# Patient Record
Sex: Female | Born: 1940 | Race: Black or African American | Hispanic: No | Marital: Married | Smoking: Never smoker
Health system: Southern US, Community
[De-identification: ages and names within clinical notes are randomized; demographics above are authoritative.]

## PROBLEM LIST (undated history)

## (undated) DIAGNOSIS — E785 Hyperlipidemia, unspecified: Secondary | ICD-10-CM

## (undated) DIAGNOSIS — T8859XA Other complications of anesthesia, initial encounter: Secondary | ICD-10-CM

## (undated) DIAGNOSIS — D649 Anemia, unspecified: Secondary | ICD-10-CM

## (undated) DIAGNOSIS — K219 Gastro-esophageal reflux disease without esophagitis: Secondary | ICD-10-CM

## (undated) DIAGNOSIS — T7840XA Allergy, unspecified, initial encounter: Secondary | ICD-10-CM

## (undated) DIAGNOSIS — H269 Unspecified cataract: Secondary | ICD-10-CM

## (undated) DIAGNOSIS — M1990 Unspecified osteoarthritis, unspecified site: Secondary | ICD-10-CM

## (undated) DIAGNOSIS — K635 Polyp of colon: Secondary | ICD-10-CM

## (undated) DIAGNOSIS — T4145XA Adverse effect of unspecified anesthetic, initial encounter: Secondary | ICD-10-CM

## (undated) DIAGNOSIS — R112 Nausea with vomiting, unspecified: Secondary | ICD-10-CM

## (undated) DIAGNOSIS — I10 Essential (primary) hypertension: Secondary | ICD-10-CM

## (undated) DIAGNOSIS — J302 Other seasonal allergic rhinitis: Secondary | ICD-10-CM

## (undated) DIAGNOSIS — Z9889 Other specified postprocedural states: Secondary | ICD-10-CM

## (undated) DIAGNOSIS — E669 Obesity, unspecified: Secondary | ICD-10-CM

## (undated) HISTORY — DX: Hyperlipidemia, unspecified: E78.5

## (undated) HISTORY — DX: Obesity, unspecified: E66.9

## (undated) HISTORY — DX: Unspecified osteoarthritis, unspecified site: M19.90

## (undated) HISTORY — DX: Polyp of colon: K63.5

## (undated) HISTORY — DX: Essential (primary) hypertension: I10

## (undated) HISTORY — DX: Allergy, unspecified, initial encounter: T78.40XA

## (undated) HISTORY — PX: ORIF FINGER FRACTURE: SHX2122

## (undated) HISTORY — DX: Unspecified cataract: H26.9

## (undated) HISTORY — DX: Gastro-esophageal reflux disease without esophagitis: K21.9

## (undated) HISTORY — PX: OTHER SURGICAL HISTORY: SHX169

## (undated) HISTORY — DX: Other seasonal allergic rhinitis: J30.2

---
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
## 1970-12-16 HISTORY — PX: TUBAL LIGATION: SHX77

## 1996-12-16 HISTORY — PX: OTHER SURGICAL HISTORY: SHX169

## 1998-08-29 ENCOUNTER — Encounter: Admission: RE | Admit: 1998-08-29 | Discharge: 1998-09-15 | Payer: Self-pay | Admitting: Anesthesiology

## 2003-09-12 ENCOUNTER — Encounter: Payer: Self-pay | Admitting: Internal Medicine

## 2005-10-02 ENCOUNTER — Ambulatory Visit: Payer: Self-pay | Admitting: Internal Medicine

## 2005-10-03 ENCOUNTER — Ambulatory Visit: Payer: Self-pay | Admitting: Internal Medicine

## 2005-11-27 ENCOUNTER — Ambulatory Visit: Payer: Self-pay | Admitting: Internal Medicine

## 2006-12-16 HISTORY — PX: EYE SURGERY: SHX253

## 2007-04-30 ENCOUNTER — Ambulatory Visit: Payer: Self-pay | Admitting: Orthopedic Surgery

## 2007-05-04 ENCOUNTER — Encounter: Payer: Self-pay | Admitting: Orthopedic Surgery

## 2007-11-16 ENCOUNTER — Encounter: Payer: Self-pay | Admitting: Internal Medicine

## 2008-06-15 ENCOUNTER — Other Ambulatory Visit: Payer: Self-pay

## 2008-06-15 ENCOUNTER — Ambulatory Visit: Payer: Self-pay | Admitting: Ophthalmology

## 2008-06-29 ENCOUNTER — Ambulatory Visit: Payer: Self-pay | Admitting: Ophthalmology

## 2008-09-15 ENCOUNTER — Ambulatory Visit: Payer: Self-pay | Admitting: Ophthalmology

## 2008-09-28 ENCOUNTER — Ambulatory Visit: Payer: Self-pay | Admitting: Ophthalmology

## 2008-12-14 ENCOUNTER — Ambulatory Visit: Payer: Self-pay | Admitting: Internal Medicine

## 2008-12-27 ENCOUNTER — Ambulatory Visit: Payer: Self-pay | Admitting: Internal Medicine

## 2008-12-27 ENCOUNTER — Encounter: Payer: Self-pay | Admitting: Internal Medicine

## 2008-12-28 ENCOUNTER — Encounter: Payer: Self-pay | Admitting: Internal Medicine

## 2010-02-02 ENCOUNTER — Encounter: Payer: Self-pay | Admitting: Orthopedic Surgery

## 2010-02-07 ENCOUNTER — Ambulatory Visit: Payer: Self-pay | Admitting: Orthopedic Surgery

## 2010-02-07 DIAGNOSIS — M1710 Unilateral primary osteoarthritis, unspecified knee: Secondary | ICD-10-CM

## 2010-02-07 DIAGNOSIS — M47817 Spondylosis without myelopathy or radiculopathy, lumbosacral region: Secondary | ICD-10-CM

## 2010-02-07 DIAGNOSIS — M1610 Unilateral primary osteoarthritis, unspecified hip: Secondary | ICD-10-CM | POA: Insufficient documentation

## 2010-04-30 ENCOUNTER — Encounter (INDEPENDENT_AMBULATORY_CARE_PROVIDER_SITE_OTHER): Payer: Self-pay | Admitting: *Deleted

## 2010-05-02 ENCOUNTER — Telehealth: Payer: Self-pay | Admitting: Orthopedic Surgery

## 2010-05-08 ENCOUNTER — Ambulatory Visit: Payer: Self-pay | Admitting: Orthopedic Surgery

## 2010-05-08 DIAGNOSIS — M76899 Other specified enthesopathies of unspecified lower limb, excluding foot: Secondary | ICD-10-CM | POA: Insufficient documentation

## 2010-05-28 ENCOUNTER — Telehealth (INDEPENDENT_AMBULATORY_CARE_PROVIDER_SITE_OTHER): Payer: Self-pay | Admitting: *Deleted

## 2010-06-01 ENCOUNTER — Telehealth: Payer: Self-pay | Admitting: Orthopedic Surgery

## 2010-06-07 ENCOUNTER — Telehealth: Payer: Self-pay | Admitting: Orthopedic Surgery

## 2010-06-07 DIAGNOSIS — K319 Disease of stomach and duodenum, unspecified: Secondary | ICD-10-CM | POA: Insufficient documentation

## 2010-06-07 DIAGNOSIS — I10 Essential (primary) hypertension: Secondary | ICD-10-CM | POA: Insufficient documentation

## 2010-06-07 DIAGNOSIS — K219 Gastro-esophageal reflux disease without esophagitis: Secondary | ICD-10-CM

## 2010-06-07 DIAGNOSIS — K7689 Other specified diseases of liver: Secondary | ICD-10-CM | POA: Insufficient documentation

## 2010-06-07 DIAGNOSIS — M129 Arthropathy, unspecified: Secondary | ICD-10-CM | POA: Insufficient documentation

## 2010-06-07 DIAGNOSIS — E1142 Type 2 diabetes mellitus with diabetic polyneuropathy: Secondary | ICD-10-CM | POA: Insufficient documentation

## 2010-06-07 DIAGNOSIS — E119 Type 2 diabetes mellitus without complications: Secondary | ICD-10-CM

## 2010-06-07 DIAGNOSIS — K648 Other hemorrhoids: Secondary | ICD-10-CM | POA: Insufficient documentation

## 2010-06-11 ENCOUNTER — Encounter: Payer: Self-pay | Admitting: Orthopedic Surgery

## 2010-06-12 ENCOUNTER — Encounter: Payer: Self-pay | Admitting: Orthopedic Surgery

## 2010-06-13 ENCOUNTER — Ambulatory Visit: Payer: Self-pay | Admitting: Internal Medicine

## 2010-06-13 DIAGNOSIS — R112 Nausea with vomiting, unspecified: Secondary | ICD-10-CM | POA: Insufficient documentation

## 2010-06-13 DIAGNOSIS — R1013 Epigastric pain: Secondary | ICD-10-CM | POA: Insufficient documentation

## 2010-06-26 ENCOUNTER — Ambulatory Visit: Payer: Self-pay | Admitting: Orthopedic Surgery

## 2010-06-26 DIAGNOSIS — M23302 Other meniscus derangements, unspecified lateral meniscus, unspecified knee: Secondary | ICD-10-CM | POA: Insufficient documentation

## 2010-08-08 ENCOUNTER — Ambulatory Visit: Payer: Self-pay | Admitting: Orthopedic Surgery

## 2011-01-15 NOTE — Assessment & Plan Note (Signed)
Summary: Follow up Visit  Loretta Barnett MR#:  [id number] Page #  NAME:  Loretta Barnett, Loretta Barnett  OFFICE NO:  [id number]  DATE:  11/27/05  DOB:  Dec 10, 2041  HISTORY:  The patient presents today for followup.  She is a 70 year old who was evaluated October 02, 2005 for abdominal pain.  See that dictation for details.  Her symptoms were felt secondary to recurrent reflux disease.  I was not certain that this would explain all of her symptoms however.  She underwent an abdominal ultrasound, which was negative.  No gallbladder disease.  She was placed on Prevacid, which she states helps her indigestion and heartburn.  She thought this may have resulted though in some constipation and gas.  She also reports occasional sharp pain from the epigastrium in between the shoulder blades.  No other issues or problems.  Overall, she is improved.    CURRENT MEDICATIONS:  Include potassium, Hyzaar, Avandamet, multivitamin, folic acid, calcium, aspirin, and Prevacid.   PHYSICAL EXAMINATION:  Finds a well-appearing female in no acute distress.  Blood pressure 150/90, heart rate 68, weight is 209.6 pounds.  HEENT: Sclerae are anicteric.  The lungs are clear. The heart is regular. Abdomen is soft without tenderness, mass, or hernia.      IMPRESSION:   1.  Gastroesophageal reflux disease.  Classic reflux symptoms are improved on Prevacid.  Possible side effect of constipation.  2.  Intermittent sharp epigastric pain with radiation in her shoulder blades, possibly due to intestinal spasm.  No evidence of gallbladder or pancreas disease by recent ultrasound.   RECOMMENDATIONS:   1.  Change from Prevacid to Protonix in hopes of less in the way of constipation.   2.  Add Levsin sublingual on a p.r.n. basis for sharp pain that radiates to the shoulder blades. 3.  Return to this office as needed for persistent or worsening symptoms.  If she were to return, she may warrant endoscopy and/or CT scan, otherwise, she will continue her general  medical care with Dr. Anne Hahn.       Wilhemina Bonito. Eda Keys., M.D. [id number] cc:  Dorise Hiss, M.D. D:  11/27/05; T:  ; Job [phone]

## 2011-01-15 NOTE — Progress Notes (Signed)
Summary: wants voltaren prescription  Phone Note Call from Patient   Summary of Call: Loretta Barnett (2041-10-20) receives the Voltaraen Gel thru the mail and has not received it .  She is going out of town  and needs a prescription called [facility] [place] so she will have it before she leaves tomorrow.   Her # [id number] or cell # [phone] Initial call taken by: Jacklynn Ganong,  May 02, 2010 11:24 AM  Follow-up for Phone Call        proceed that's okay Follow-up by: Fuller Canada MD,  May 02, 2010 11:29 AM    Prescriptions: VOLTAREN 1 % GEL (DICLOFENAC SODIUM) 4g  two times a day  #5 tubes x 5   Entered by:   Ether Griffins   Authorized by:   Fuller Canada MD   Signed by:   Ether Griffins on 05/02/2010   Method used:   [facility]axed to ...       [facility]  [address] [id number] (retail[facility]       [address]       [place], [place]  [address]       Ph: [phone] or [phone]       Fax: [phone]   RxID:   [id number]

## 2011-01-15 NOTE — Letter (Signed)
Summary: New Patient letter  [facility]  [address] [place], [place] [address]   Phone: [phone]  Fax: [phone]       04/30/2010 MRN: [id number]  [facility] [address] [place], [place]  [address]  Dear Loretta Barnett,  Welcome to the Gastroenterology Division at [facility].    You are scheduled to see Dr.  Marina Goodell on 06-13-10 at 10AM on the 3rd floor at [facility], [address]  We ask that you try to arrive at our office 15 minutes prior to your appointment time to allow for check-in.  We would like you to complete the enclosed self-administered evaluation form prior to your visit and bring it with you on the day of your appointment.  We will review it with you.  Also, please bring a complete list of all your medications or, if you prefer, bring the medication bottles and we will list them.  Please bring your insurance card so that we may make a copy of it.  If your insurance requires a referral to see a specialist, please bring your referral form from your primary care physician.  Co-payments are due at the time of your visit and may be paid by cash, check or credit card.     Your office visit will consist of a consult with your physician (includes a physical exam), any laboratory testing he/she may order, scheduling of any necessary diagnostic testing (e.g. x-ray, ultrasound, CT-scan), and scheduling of a procedure (e.g. Endoscopy, Colonoscopy) if required.  Please allow enough time on your schedule to allow for any/all of these possibilities.    If you cannot keep your appointment, please call [phone] to cancel or reschedule prior to your appointment date.  This allows [place] the opportunity to schedule an appointment for another patient in need of care.  If you do not cancel or reschedule by 5 p.m. the business day prior to your appointment date, you will be charged a $50.00 late cancellation/no-show fee.    Thank you for choosing [facility] for your medical needs.  We appreciate the opportunity to care for you.  Please visit [place] at our website  to learn more about our practice.                     Sincerely,                                                             The Gastroenterology Division

## 2011-01-15 NOTE — Assessment & Plan Note (Signed)
Summary: MID EPIGASTRIC & UPPER R Q PAIN/NAUSEA & VOMITTING/YF   History of Present Illness Visit Type: consult  Primary GI MD: Yancey Flemings MD Primary Provider: Lucianne Lei, MD  Requesting Provider: Lucianne Lei, MD  Chief Complaint: Generalized abd pain, nausea, vomiting, belching, bloating, and GERD  History of Present Illness:   70 year old female with diabetes mellitus x20 years, hypertension, arthritis, obesity, and GERD. She presents today for evaluation of chronic problems with nausea and vomiting as well as recent problems with epigastric discomfort. She was last evaluated in our endoscopy center in January of 2004 routine screening colonoscopy due to a family history. The examination was normal except for a diminutive non-adenomatous polyp which was removed. She tells me that she has had a one-year history of intermittent problems with nausea and vomiting. She feels that she is intolerant to foods that contain soy as well as certain meats and fruits. She feels that she has "poor digestion". She has the symptoms once every 2 weeks, generally in the morning after taking medications. In May she developed problems with epigastric pain and rectal quadrant discomfort with radiation to the back. She was having reflux symptoms at that time on over-the-counter Prilosec. She had also been on NSAIDs for joint aches in her knee. She was instructed to stop NSAIDs and was started on Dexilant 60 mg daily. She also underwent an abdominal ultrasound on May 07, 2010 at [facility]. This was unremarkable. No gallstones. Her weight has been stable. No GI bleeding.   GI Review of Systems    Reports abdominal pain, acid reflux, belching, bloating, heartburn, nausea, and  vomiting.     Location of  Abdominal pain: upper abdomen.    Denies chest pain, dysphagia with liquids, dysphagia with solids, loss of appetite, vomiting blood, weight loss, and  weight gain.        Denies anal fissure, black tarry stools,  change in bowel habit, constipation, diarrhea, diverticulosis, fecal incontinence, heme positive stool, hemorrhoids, irritable bowel syndrome, jaundice, light color stool, liver problems, rectal bleeding, and  rectal pain.    Current Medications (verified): 1)  Voltaren 1 % Gel (Diclofenac Sodium) .... 4g  Two Times A Day 2)  Dexilant 60 Mg Cpdr (Dexlansoprazole) .... Take One By Mouth Once Daily 3)  Promethazine Hcl 25 Mg Tabs (Promethazine Hcl) .... Take One By Mouth Every 6 Hours As Needed 4)  Metformin Hcl 1000 Mg Tabs (Metformin Hcl) .... One Tablet By Mouth Two Times A Day 5)  Hyzaar 100-25 Mg Tabs (Losartan Potassium-Hctz) .... One By Mouth Once Daily 6)  Lasix 40 Mg Tabs (Furosemide) .... 1/2  Tablet By Mouth Once Daily 7)  Caltrate 600+d 600-400 Mg-Unit Tabs (Calcium Carbonate-Vitamin D) .... Take Two By Mouth Once Daily 8)  Januvia 100 Mg Tabs (Sitagliptin Phosphate) .... One Tablet By Mouth Once Daily 9)  Vitamin C 500 Mg Tabs (Ascorbic Acid) .... One By Mouth Once Daily  Allergies (verified): 1)  ! Lidocaine 2)  ! Codeine 3)  ! Darvocet 4)  ! * All Caines  Past History:  Past Medical History: Arthritis seasonal allergies Diabetes Hypertension GERD Obesity  Past Surgical History: Reviewed history from 02/07/2010 and no changes required. Fracture left leg and ankle has plates and screws tonsils adenoids tubal ligation arthroscopy right knee.  Family History: Reviewed history from 06/07/2010 and no changes required. FH of Cancer: father Family History of Diabetes: father Family History of Arthritis: mother Family History of Pancreatic Cancer: father  Social History:  Reviewed history from 02/07/2010 and no changes required. 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  The patient denies anemia,  anxiety-new, blood in urine, breast changes/lumps, change in vision, confusion, cough, coughing up blood, depression-new, fainting, fatigue, fever, headaches-new, hearing problems, heart murmur, heart rhythm changes, itching, menstrual pain, muscle pains/cramps, night sweats, nosebleeds, pregnancy symptoms, shortness of breath, skin rash, sleeping problems, sore throat, swollen lymph glands, thirst - excessive , urination - excessive , urination changes/pain, urine leakage, vision changes, and voice change.    Vital Signs:  Patient profile:   70 year old female Height:      65 inches Weight:      197 pounds BMI:     32.90 BSA:     1.97 Pulse rate:   82 / minute Pulse rhythm:   regular BP sitting:   132 / 84  (right arm) Cuff size:   regular  Vitals Entered By: Ok Anis CMA (June 13, 2010 10:15 AM)  Physical Exam  General:  Well developed, well nourished, no acute distress. Head:  Normocephalic and atraumatic. Eyes:  PERRLA, no icterus. Nose:  No deformity, discharge,  or lesions. Mouth:  No deformity or lesions, dentition normal. Neck:  Supple; no masses or thyromegaly. Lungs:  Clear throughout to auscultation. Heart:  Regular rate and rhythm; no murmurs, rubs,  or bruits. Abdomen:  Soft, obese,nontender and nondistended. No masses, hepatosplenomegaly or hernias noted. Normal bowel sounds. No succussion splash. Msk:  Symmetrical with no gross deformities. Normal posture. Pulses:  Normal pulses noted. Extremities:  No  edema or deformities noted. Neurologic:  Alert and  oriented x4;  Skin:  Intact without significant lesions or rashes. Psych:  Alert and cooperative. Normal mood and affect.   Impression & Recommendations:  Problem # 1:  ABDOMINAL PAIN-EPIGASTRIC (ICD-789.06) Recent problems with epigastric pain likely due to worsening GERD versus NSAID-related gastropathy. Symptoms improved on higher dose PPI and NSAID avoidance.  Plan: #1. Continued excellent. She has a  prescription. Additional samples provided #2. Reflux precautions #3. Avoid unnecessary NSAIDs #4. Follow up here p.r.n.  Problem # 2:  NAUSEA AND VOMITING (ICD-787.01) chronic intermittent problems with nausea and vomiting. 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 This may exacerbate underlying GERD.  Plan: #1. Gastroparesis diet #2. Continue Dexilant #3. Follow up here p.r.n.  Problem # 3:  GERD (ICD-530.81) GERD not controlled with Prilosec. Fruit on excellent. Suspect worsening gastric emptying has demonstrated GERD. She will continue with Dexilant and reflux precautions.  Problem # 4:  COLORECTAL CANCER, FAMILY HX (ICD-V16.0) prior screening exam x2 negative for neoplasia. Due for routine followup around 2015  Patient Instructions: 1)  samples of Dexilant given to patient. 2)  Please schedule a follow-up appointment as needed.  3)  The medication list was reviewed and reconciled.  All changed / newly prescribed medications were explained.  A complete medication list was provided to the patient / caregiver. 4)  Copy: Dr. Lucianne Lei

## 2011-01-15 NOTE — Progress Notes (Signed)
Summary: call from patient LT knee question / MRI   Phone Note Call from Patient   Caller: Patient Summary of Call: Patient called back c/o continuing LT knee pain. States gel not seeming to help much. States LT knee hurts "inside & in back of knee" and that it "buckles"  sometimes.  Asked if she can possibly have an MRI   Please advise.   Pt's Home# [phone] and Work [phone] Initial call taken by: Cammie Sickle,  May 28, 2010 9:24 AM  Follow-up for Phone Call        MRI left knee for pain and buckling  Follow-up by: Fuller Canada MD,  May 28, 2010 9:32 AM
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
## 2011-01-15 NOTE — Procedures (Signed)
Summary: EGD/[facility]  EGD/[facility]   Imported By: Sherian Rein 06/14/2010 07:11:21  _____________________________________________________________________  External Attachment:    Type:   Image     Comment:   External Document

## 2011-01-15 NOTE — Assessment & Plan Note (Signed)
Summary: discuss knee scope/medicare/uhc.cbt   Visit Type:  Follow-up Referring Provider:  Lucianne Lei, MD  Primary Provider:  Lucianne Lei, MD   CC:  left knee pain.  History of Present Illness: I saw Loretta Barnett in the office today for a followup visit.  She is a 70 years old woman with the complaint of:  left knee.  Meds: Hyzaar, Metformin, Januvia, Multivitamin, Zyrtec, Ibuprofen 200mg  as needed, Voltaren gel, ASA, Tylenol, Vitamin C. Folic acid.  [facility] and it showed a meniscal tear with arthritis of the medial compartment she is here today to discuss possible surgery on the LEFT knee  After discussing this with her she is not having constant pain and only has infrequent giving way episodes.  We decided to wait on having any surgery until her symptoms worsen or giving way he becomes more problematic  MRI reviewed with report       Allergies: 1)  ! Lidocaine 2)  ! Codeine 3)  ! Darvocet 4)  ! * All Caines   Impression & Recommendations:  Problem # 1:  KNEE, ARTHRITIS, DEGEN./OSTEO (ICD-715.96) Assessment Unchanged  Orders: Est. Patient Level II (04540)  Problem # 2:  DERANGEMENT MENISCUS (ICD-717.5) Assessment: Unchanged  Orders: Est. Patient Level II (98119)  Patient Instructions: 1)  Please schedule a follow-up appointment in 6 weeks 2)  review the knee if things get worse before then call us sooner

## 2011-01-15 NOTE — Progress Notes (Signed)
Summary: MRI appointment.  Phone Note Outgoing Call   Call placed by: Waldon Reining,  June 07, 2010 11:58 AM Call placed to: Patient Action Taken: Appt scheduled Summary of Call: I called to give the patient her MRI appointment at [facility] on 06-11-10 at 5:15. Patient has Medicare and [employer], no precert is needed. Dr. Romeo Apple will call her with results. Patient will bring her films here to the office.

## 2011-01-15 NOTE — Procedures (Signed)
Summary: Colonoscopy   Colonoscopy  Procedure date:  09/12/2003  Findings:      Results: Hemorrhoids.     Location:  [facility].    Procedures Next Due Date:    Colonoscopy: 09/2008 Patient Name: Loretta Barnett, Loretta Barnett MRN:  Procedure Procedures: Colonoscopy CPT: 54098.  Personnel: Endoscopist: Wilhemina Bonito. Marina Goodell, MD.  Exam Location: Exam performed in Outpatient Clinic. Outpatient  Patient Consent: Procedure, Alternatives, Risks and Benefits discussed, consent obtained, from patient. Consent was obtained by the RN.  Indications Symptoms: Hematochezia.  Increased Risk Screening: For family history of colorectal neoplasia, in  parent  History  Pre-Exam Physical: Performed Sep 12, 2003. Entire physical exam was normal.  Exam Exam: Extent of exam reached: Cecum, extent intended: Cecum.  The cecum was identified by appendiceal orifice and IC valve. Patient position: on left side. Colon retroflexion performed. Images taken. ASA Classification: II. Tolerance: excellent.  Monitoring: Pulse and BP monitoring, Oximetry used. Supplemental O2 given.  Colon Prep Used Golytely for colon prep. Prep results: good.  Sedation Meds: Patient assessed and found to be appropriate for moderate (conscious) sedation. Fentanyl 50 mcg. given IV. Versed 5 mg. given IV.  Findings NORMAL EXAM: Cecum to Rectum. Comments: small internal hemorrhoids present.   Assessment Abnormal examination, see findings above.  Diagnoses: 455.0: Hemorrhoids, Internal.   Events  Unplanned Interventions: No intervention was required.  Unplanned Events: There were no complications. Plans Disposition: After procedure patient sent to recovery. After recovery patient sent home.  Scheduling/Referral: Colonoscopy, to Wilhemina Bonito. Marina Goodell, MD, in 5 years.,    This report was created from the original endoscopy report, which was reviewed and signed by the above listed endoscopist.   cc: Lucianne Lei, MD  The Patient

## 2011-01-15 NOTE — Miscellaneous (Signed)
  spoke to [facility], she needs surgery;  will call back   Clinical Lists Changes

## 2011-01-15 NOTE — Miscellaneous (Signed)
Summary: phone numbers  [phone]  [phone]  [phone]  Clinical Lists Changes

## 2011-01-15 NOTE — Letter (Signed)
Summary: Historic Patient File  Historic Patient File   Imported By: Elvera Maria 02/09/2010 10:07:25  _____________________________________________________________________  External Attachment:    Type:   Image     Comment:   history form

## 2011-01-15 NOTE — Assessment & Plan Note (Signed)
Summary: LT KNEE PAIN/NEED XRAYS/MEDICARE,UHC/CAF   Visit Type:  Follow-up Referring Provider:  self Primary Provider:  Dr. Lucianne Lei  CC:  left knee pain.  History of Present Illness: I saw Loretta Barnett in the office today for a followup visit.  She is a 70 years old woman with the complaint of:  lef tknee.  Meds: Hyzaar, Metformin, Januvia, Multivitamin, Zyrtec, Ibuprofen 200mg  as needed, Voltaren gel, Tylenol.  Last seen in 02/11 for her knee.  She has had swelling in her leg for 3 weeks now. She went to See her primary care doctor last week and he said everything check out good.  She is not taking the Tylenol that often because it was hurting her stomach.  She is having pain on the inside of her knee at the bursae.  Allergies: 1)  ! Lidocaine 2)  ! Codeine 3)  ! Darvocet  Review of Systems Neurologic:  Denies numbness, tingling, and unsteady gait. Musculoskeletal:  Complains of joint pain and swelling; denies instability and stiffness.  Physical Exam  Additional Exam:  1. General appearance was normal  2. Oriented x 3  3. Mood was normal  4. Gait was normal  LEFT knee inspection there was a swollen tender peds anserine bursa with a nontender medial joint line, range of motion was not affected joint was stable motor grade was 5.  Skin is intact pulses and sensation were normal.      Impression & Recommendations:  Problem # 1:  BURSITIS, LEFT KNEE (ICD-726.60) Assessment New  got about giving her an injection but she said last time her glucose went out and she had to pull over on the side of the road because of dizziness  As I recommended topical anti-inflammatory twice a day  Orders: Est. Patient Level III (16109)  Patient Instructions: 1)  Voltaren gel two times a day over sore area on the knee 2)  come back as needed

## 2011-01-15 NOTE — Assessment & Plan Note (Signed)
Summary: 6 WK RE-CK KNEE/MEDICARE,UHC/CAF   Visit Type:  Follow-up Referring Provider:  Lucianne Lei, MD  Primary Provider:  Lucianne Lei, MD   CC:  recheck knee.  History of Present Illness: I saw Loretta Barnett in the office today for a followup visit.  She is a 70 years old woman with the complaint of:  left knee.  Meds: Hyzaar, Metformin, Januvia, Multivitamin, Zyrtec, Ibuprofen 200mg  as needed, Voltaren gel, ASA, Tylenol, Vitamin C. Folic acid.   MRI which showed arthritis and torn medial meniscus we decided against surgery at this point  Today is a 6 week recheck left knee.  Doing better, using heat, ice, Aleve as needed and Voltaren gel.       Allergies: 1)  ! Lidocaine 2)  ! Codeine 3)  ! Darvocet 4)  ! * All [place]  Physical Exam  Additional Exam:  the knee looks good today.  She has full range of motion.  Excellent quadriceps strength.  Maybe a hint of swelling in the lateral compartment.  She is ambulating well.  She has normal sensation.  Skin is intact.  The knee is stable.  The only thing I detected was a small amount of pain with the McMurray's maneuver.   Impression & Recommendations:  Problem # 1:  DERANGEMENT MENISCUS (ICD-717.5) Assessment Improved  Orders: Est. Patient Level II (91478)

## 2011-01-15 NOTE — Progress Notes (Signed)
Summary: insurance verification  Phone Note Outgoing Call   Call placed to: Insurer Summary of Call: [employer] + call to patient. Verified Medicare primary as of 12/16/2009 and [place] [employer] secondary, under husband, retiree [employer]. 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 Verified w/BCBS per [place], [place] [phone].   I verified all current ins-related information as follows:  [employer] for Medicare (active, primary); [employer] through automated system, active secondary.  Initial call taken by: Cammie Sickle,  June 01, 2010 10:08 AM

## 2011-01-18 NOTE — Progress Notes (Signed)
Summary: Office Visit  Office Visit   Imported By: Elvera Maria 02/06/2010 11:10:36  _____________________________________________________________________  External Attachment:    Type:   Image     Comment:   initial eval

## 2011-04-01 LAB — GLUCOSE, CAPILLARY
Glucose-Capillary: 107 mg/dL — ABNORMAL HIGH (ref 70–99)
Glucose-Capillary: 117 mg/dL — ABNORMAL HIGH (ref 70–99)

## 2012-03-02 ENCOUNTER — Ambulatory Visit (INDEPENDENT_AMBULATORY_CARE_PROVIDER_SITE_OTHER): Payer: Medicare Other | Admitting: Internal Medicine

## 2012-03-02 ENCOUNTER — Encounter: Payer: Self-pay | Admitting: Internal Medicine

## 2012-03-02 VITALS — BP 118/60 | HR 66 | Ht 65.0 in | Wt 191.6 lb

## 2012-03-02 DIAGNOSIS — R141 Gas pain: Secondary | ICD-10-CM

## 2012-03-02 DIAGNOSIS — K219 Gastro-esophageal reflux disease without esophagitis: Secondary | ICD-10-CM

## 2012-03-02 DIAGNOSIS — R197 Diarrhea, unspecified: Secondary | ICD-10-CM

## 2012-03-02 DIAGNOSIS — R1011 Right upper quadrant pain: Secondary | ICD-10-CM

## 2012-03-02 MED ORDER — ALIGN 4 MG PO CAPS: 1.0000 | ORAL_CAPSULE | Freq: Every day | ORAL | Status: DC

## 2012-03-02 NOTE — Progress Notes (Signed)
HISTORY OF PRESENT ILLNESS:  Loretta Barnett is a 71 y.o. female with long-standing diabetes mellitus, hypertension, arthritis, obesity, GERD, and family history of colon cancer. The patient was last seen in the office 06/13/2010. See that dictation. Prior colonoscopies in 2004 and most recently January 2010. Most recent exam with diminutive non-adenomatous polyp only. She presents today regarding diarrhea and right upper quadrant pain. First, she reports developing severe diarrhea at the end of January. The problem persisted for about 4 weeks that resolved. Currently being plagued with increased intestinal gas. No weight loss or GI bleeding. No vomiting. She also reports a many month history of fleeting right upper quadrant pain that can occur on a daily basis. She describes the discomfort as sharp and lasting only a few seconds. No exacerbating or relieving factors. She tells me that she underwent an abdominal ultrasound in December which was negative. Overall, her medical problems have been stable. Last hemoglobin A1c reported to be 6.6.  REVIEW OF SYSTEMS:  All non-GI ROS negative except for arthritis  Past Medical History  Diagnosis Date  . Arthritis   . Seasonal allergies   . Diabetes mellitus   . Hypertension   . GERD (gastroesophageal reflux disease)   . Obesity   . Colon polyp     Past Surgical History  Procedure Date  . Leg surgery     left leg and ankle fracture  . Tonsillectomy and adenoidectomy   . Tubal ligation   . Knee arthroscopy     rt    Social History Loretta Barnett  reports that she has never smoked. She has never used smokeless tobacco. She reports that she does not drink alcohol or use illicit drugs.  family history includes Arthritis in her mother; Diabetes in her father; and Pancreatic cancer in her father.  Allergies  Allergen Reactions  . Codeine     REACTION: nausea/vomiting  . Lidocaine     REACTION: wheezing  . Propoxyphene N-Acetaminophen    REACTION: nausea/vomiting       PHYSICAL EXAMINATION: Vital signs: BP 118/60  Pulse 66  Ht 5\' 5"  (1.651 m)  Wt 191 lb 9.6 oz (86.909 kg)  BMI 31.88 kg/m2 General: Well-developed, well-nourished, no acute distress HEENT: Sclerae are anicteric, conjunctiva pink. Oral mucosa intact Lungs: Clear Heart: Regular Abdomen: soft,obese, nontender, nondistended, no obvious ascites, no peritoneal signs, normal bowel sounds. No organomegaly. Extremities: No edema Psychiatric: alert and oriented x3. Cooperative    ASSESSMENT:  #1. Acute self-limited diarrheal illness. Possibly [place] virus. Resolved. #2. Increased intestinal gas. Nonspecific #3. Fleeting sharp right upper quadrant pain most consistent with musculoskeletal #4. Family history of colon cancer. Last colonoscopy January 2010 with non-adenomatous polyp #5. Non-GI medical problems   PLAN:  #1. Anti-gas and flatulence dietary sheet #2. Probiotic align one daily for 2 weeks. Samples given #3. Reassurance regarding right upper quadrant pain #4. Surveillance colonoscopy around January 2015. Interval GI followup as needed #5. Return to the care of your primary provider

## 2012-03-02 NOTE — Patient Instructions (Signed)
We have given you samples of Align. This puts good bacteria back into your intestines. You should take 1 capsule by mouth once daily. If this works well for you, it can be purchased over the counter.  Please follow up as needed 

## 2012-11-04 ENCOUNTER — Encounter: Payer: Self-pay | Admitting: Orthopedic Surgery

## 2012-11-04 ENCOUNTER — Ambulatory Visit (INDEPENDENT_AMBULATORY_CARE_PROVIDER_SITE_OTHER): Payer: Medicare Other

## 2012-11-04 ENCOUNTER — Ambulatory Visit (INDEPENDENT_AMBULATORY_CARE_PROVIDER_SITE_OTHER): Payer: Medicare Other | Admitting: Orthopedic Surgery

## 2012-11-04 VITALS — Ht 65.0 in | Wt 185.0 lb

## 2012-11-04 DIAGNOSIS — M25579 Pain in unspecified ankle and joints of unspecified foot: Secondary | ICD-10-CM

## 2012-11-04 DIAGNOSIS — M76829 Posterior tibial tendinitis, unspecified leg: Secondary | ICD-10-CM

## 2012-11-04 DIAGNOSIS — M6789 Other specified disorders of synovium and tendon, multiple sites: Secondary | ICD-10-CM

## 2012-11-04 DIAGNOSIS — M25519 Pain in unspecified shoulder: Secondary | ICD-10-CM

## 2012-11-04 DIAGNOSIS — M75102 Unspecified rotator cuff tear or rupture of left shoulder, not specified as traumatic: Secondary | ICD-10-CM

## 2012-11-04 DIAGNOSIS — M67919 Unspecified disorder of synovium and tendon, unspecified shoulder: Secondary | ICD-10-CM

## 2012-11-04 DIAGNOSIS — M47812 Spondylosis without myelopathy or radiculopathy, cervical region: Secondary | ICD-10-CM | POA: Insufficient documentation

## 2012-11-04 NOTE — Patient Instructions (Addendum)
Diagnosis  #1. Posterior tibial tendon disorder; wear brace x 6 weeks  #2. Neck arthritis #3. Rotator cuff syndrome #4. Arthritis ankle    You have received a steroid shot. 15% of patients experience increased pain at the injection site with in the next 24 hours. This is best treated with ice and tylenol extra strength 2 tabs every 8 hours. If you are still having pain please call the office.   Posterior Tibial Tendon Tendinitis with Rehab Tendonitis is a condition that is characterized by inflammation of a tendon or the lining (sheath) that surrounds it. The inflammation is usually caused by damage to the tendon, such as a tendon tear (strain). Sprains are classified into three categories. Grade 1 sprains cause pain, but the tendon is not lengthened. Grade 2 sprains include a lengthened ligament due to the ligament being stretched or partially ruptured. With grade 2 sprains there is still function, although the function may be diminished. Grade 3 sprains are characterized by a complete tear of the tendon or muscle, and function is usually impaired. Posterior tibialis tendonitis is tendonitis of the posterior tibial tendon, which attaches muscles of the lower leg to the foot. The posterior tibial tendon is located in the back of the ankle and helps the body straighten (plantarflex) and rotate inward (medially rotate) the ankle. SYMPTOMS    Pain, tenderness, swelling, warmth, and/or redness over the back of the inner ankle at the posterior tibial tendon or the inner part of the mid-foot.   Pain that worsens with plantarflexion or medial rotation of the ankle.   A crackling sound (crepitation) when the tendon is moved or touched.  CAUSES   Posterior tibial tendonitis occurs when damage to the posterior tibial tendon starts an inflammatory response. Common mechanisms of injury include:  Degenerative (occurs with aging) processes that weaken the tendon and make it more susceptible to injury.    Stress placed on the tendon from an increase in the intensity, frequency, or duration of training.   Direct trauma to the ankle.   Returning to activity before a previous ankle injury is allowed to heal.  RISK INCREASES WITH:  Activities that involve repetitive and/or stressful plantarflexion (jumping, kicking, or running up/down hills).   Poor strength and flexibility.   Flat feet.   Previous injury to the foot, ankle, or leg.  PREVENTION    Warm up and stretch properly before activity.   Allow for adequate recovery between workouts.   Maintain physical fitness:   Strength, flexibility, and endurance.   Cardiovascular fitness.   Learn and use proper technique. When possible, have a coach correct improper technique.   Complete rehabilitation from a previous foot, ankle, or leg injury.   If you have flat feet, wear arch supports (orthotics).  PROGNOSIS   If treated properly, then the symptoms of tendonitis usually resolve within 6 weeks. This period may be shorter for injuries caused by direct trauma. RELATED COMPLICATIONS    Prolonged healing time, if improperly treated or re-injured.   Recurrent symptoms that result in a chronic problem.   Partial or complete tendon tear (rupture) requiring surgery.  TREATMENT   Treatment initially involves the use of ice and medication to help reduce pain and inflammation. The use of strengthening and stretching exercises may help reduce pain with activity. These exercises may be performed at home or with referral to a therapist. Often times, your caregiver will recommend immobilizing the ankle to allow the tendon to heal. If you have flat feet,  the you may be advised to wear orthotic arch supports. If symptoms persist for greater than 6 months despite non-surgical (conservative) treatment, then surgery may be recommended. MEDICATION    If pain medication is necessary, then nonsteroidal anti-inflammatory medications, such as aspirin  and ibuprofen, or other minor pain relievers, such as acetaminophen, are often recommended.   Do not take pain medication for 7 days before surgery.   Prescription pain relievers may be given if deemed necessary by your caregiver. Use only as directed and only as much as you need.   Corticosteroid injections may be given by your caregiver. These injections should be reserved for the most serious cases, because they may only be given a certain number of times.  HEAT AND COLD  Cold treatment (icing) relieves pain and reduces inflammation. Cold treatment should be applied for 10 to 15 minutes every 2 to 3 hours for inflammation and pain and immediately after any activity that aggravates your symptoms. Use ice packs or massage the area with a piece of ice (ice massage).   Heat treatment may be used prior to performing the stretching and strengthening activities prescribed by your caregiver, physical therapist, or athletic trainer. Use a heat pack or soak the injury in warm water.  SEEK MEDICAL CARE IF:  Treatment seems to offer no benefit, or the condition worsens.   Any medications produce adverse side effects.  Impingement Syndrome, Rotator Cuff, Bursitis with Rehab Impingement syndrome is a condition that involves inflammation of the tendons of the rotator cuff and the subacromial bursa, that causes pain in the shoulder. The rotator cuff consists of four tendons and muscles that control much of the shoulder and upper arm function. The subacromial bursa is a fluid filled sac that helps reduce friction between the rotator cuff and one of the bones of the shoulder (acromion). Impingement syndrome is usually an overuse injury that causes swelling of the bursa (bursitis), swelling of the tendon (tendonitis), and/or a tear of the tendon (strain). Strains are classified into three categories. Grade 1 strains cause pain, but the tendon is not lengthened. Grade 2 strains include a lengthened ligament, due  to the ligament being stretched or partially ruptured. With grade 2 strains there is still function, although the function may be decreased. Grade 3 strains include a complete tear of the tendon or muscle, and function is usually impaired. SYMPTOMS    Pain around the shoulder, often at the outer portion of the upper arm.   Pain that gets worse with shoulder function, especially when reaching overhead or lifting.   Sometimes, aching when not using the arm.   Pain that wakes you up at night.   Sometimes, tenderness, swelling, warmth, or redness over the affected area.   Loss of strength.   Limited motion of the shoulder, especially reaching behind the back (to the back pocket or to unhook bra) or across your body.   Crackling sound (crepitation) when moving the arm.   Biceps tendon pain and inflammation (in the front of the shoulder). Worse when bending the elbow or lifting.  CAUSES   Impingement syndrome is often an overuse injury, in which chronic (repetitive) motions cause the tendons or bursa to become inflamed. A strain occurs when a force is paced on the tendon or muscle that is greater than it can withstand. Common mechanisms of injury include: Stress from sudden increase in duration, frequency, or intensity of training.  Direct hit (trauma) to the shoulder.   Aging, erosion of the  tendon with normal use.   Bony bump on shoulder (acromial spur).  RISK INCREASES WITH:  Contact sports (football, wrestling, boxing).   Throwing sports (baseball, tennis, volleyball).   Weightlifting and bodybuilding.   Heavy labor.   Previous injury to the rotator cuff, including impingement.   Poor shoulder strength and flexibility.   Failure to warm up properly before activity.   Inadequate protective equipment.   Old age.   Bony bump on shoulder (acromial spur).  PREVENTION    Warm up and stretch properly before activity.   Allow for adequate recovery between workouts.    Maintain physical fitness:   Strength, flexibility, and endurance.   Cardiovascular fitness.   Learn and use proper exercise technique.  PROGNOSIS   If treated properly, impingement syndrome usually goes away within 6 weeks. Sometimes surgery is required.   RELATED COMPLICATIONS    Longer healing time if not properly treated, or if not given enough time to heal.   Recurring symptoms, that result in a chronic condition.   Shoulder stiffness, frozen shoulder, or loss of motion.   Rotator cuff tendon tear.   Recurring symptoms, especially if activity is resumed too soon, with overuse, with a direct blow, or when using poor technique.  TREATMENT   Treatment first involves the use of ice and medicine, to reduce pain and inflammation. The use of strengthening and stretching exercises may help reduce pain with activity. These exercises may be performed at home or with a therapist. If non-surgical treatment is unsuccessful after more than 6 months, surgery may be advised. After surgery and rehabilitation, activity is usually possible in 3 months.   MEDICATION  If pain medicine is needed, nonsteroidal anti-inflammatory medicines (aspirin and ibuprofen), or other minor pain relievers (acetaminophen), are often advised.   Do not take pain medicine for 7 days before surgery.   Prescription pain relievers may be given, if your caregiver thinks they are needed. Use only as directed and only as much as you need.   Corticosteroid injections may be given by your caregiver. These injections should be reserved for the most serious cases, because they may only be given a certain number of times.  HEAT AND COLD  Cold treatment (icing) should be applied for 10 to 15 minutes every 2 to 3 hours for inflammation and pain, and immediately after activity that aggravates your symptoms. Use ice packs or an ice massage.   Heat treatment may be used before performing stretching and strengthening activities  prescribed by your caregiver, physical therapist, or athletic trainer. Use a heat pack or a warm water soak.  SEEK MEDICAL CARE IF:    Symptoms get worse or do not improve in 4 to 6 weeks, despite treatment.   New, unexplained symptoms develop. (Drugs used in treatment may produce side effects.)

## 2012-11-04 NOTE — Progress Notes (Signed)
Patient ID: Loretta Barnett, female   DOB: [birth year], 71 y.o.   MRN: [id number] Chief Complaint  Patient presents with  . Ankle Pain    Left ankle pain.    Problem #1 left ankle  Patient open treatment internal fixation left ankle in 1998. She complains now that the ankle feels like it shifting it's painful for her to walk she can't bear weight until she is up on her feet for several minutes and then it eases off. The pain is anterior radiates proximally and into the third digit she also has some lateral foot pain on the dorsal aspect  Problem #2 left shoulder pain  The pain goes down the arm but stays above the elbow she complains of clicking and painful range of motion including night pain. She injured her shoulder in 1976 patient while she was falling. She was treated with medication.  Review of systems include eye pain, joint pain and stiffness, numbness tingling unsteady gait and seasonal allergies  Vital signs are stable as recorded  General appearance is normal  The patient is alert and oriented x3  The patient's mood and affect are normal  Gait assessment: She is ambulatory but with a limp favoring the left foot and ankle The cardiovascular exam reveals normal pulses and temperature without edema swelling.  The lymphatic system is negative for palpable lymph nodes  The sensory exam is normal.  There are no pathologic reflexes.  Balance is normal.   Exam of the left foot shows a flat foot is flexible. She has tenderness and swelling on the medial side of the ankle. She has weakness with a poor single leg heel raise. Ankle stable. Palpable medial tenderness palpable lateral gutter tenderness painful eversion of the foot skin intact  Left shoulder tenderness in the posterior subacromial space. Rotator interval nontender. Passive range of motion normal. Active range of motion near normal. Shoulder stable. Strength in the cuff normal. Neer test negative Hawkins test  positive  X-ray left shoulder shows no glenohumeral arthritis type II acromion perhaps calcific tendinitis  Left ankle shows mild arthrosis intact lateral hardware and one anterior posterior screw  Assessment 1. Ankle pain  DG Ankle Complete Left  2. Shoulder pain  DG Shoulder Left  3. Rotator cuff syndrome of left shoulder    4. Posterior tibial tendon dysfunction    5. Cervical spondylosis without myelopathy  Ambulatory referral to Physical Therapy     Addendum she has decreased range of motion painful range of motion of the cervical spine with midline cervical tenderness and left-sided trapezial tenderness  Our plan is for Cam Walker short version left foot physical therapy for the cervical spondylosis injection for the subacromial impingement on the left side  Subacromial Shoulder Injection Procedure Note  Pre-operative Diagnosis: left RC Syndrome  Post-operative Diagnosis: same  Indications: pain   Anesthesia: ethyl chloride   Procedure Details   Verbal consent was obtained for the procedure. The shoulder was prepped withalcohol and the skin was anesthetized. A 20 gauge needle was advanced into the subacromial space through posterior approach without difficulty  The space was then injected with 3 ml 1% lidocaine and 1 ml of depomedrol. The injection site was cleansed with isopropyl alcohol and a dressing was applied.  Complications:  None; patient tolerated the procedure well.

## 2012-12-17 ENCOUNTER — Ambulatory Visit: Payer: Medicare Other | Admitting: Orthopedic Surgery

## 2013-01-07 ENCOUNTER — Encounter: Payer: Self-pay | Admitting: Orthopedic Surgery

## 2013-01-07 ENCOUNTER — Ambulatory Visit (INDEPENDENT_AMBULATORY_CARE_PROVIDER_SITE_OTHER): Payer: Medicare Other | Admitting: Orthopedic Surgery

## 2013-01-07 VITALS — BP 118/74 | Ht 65.0 in | Wt 185.0 lb

## 2013-01-07 DIAGNOSIS — M75102 Unspecified rotator cuff tear or rupture of left shoulder, not specified as traumatic: Secondary | ICD-10-CM

## 2013-01-07 DIAGNOSIS — M6789 Other specified disorders of synovium and tendon, multiple sites: Secondary | ICD-10-CM

## 2013-01-07 DIAGNOSIS — M76829 Posterior tibial tendinitis, unspecified leg: Secondary | ICD-10-CM

## 2013-01-07 DIAGNOSIS — M67919 Unspecified disorder of synovium and tendon, unspecified shoulder: Secondary | ICD-10-CM

## 2013-01-07 DIAGNOSIS — M719 Bursopathy, unspecified: Secondary | ICD-10-CM

## 2013-01-07 NOTE — Progress Notes (Signed)
Patient ID: Loretta Barnett, female   DOB: 01/25/1941, 72 y.o.   MRN: [id number] Chief Complaint  Patient presents with  . Follow-up    follow up PTTD and rotator cuff    IMPROVED   SHOULDER AND ANKLE / FOOT   SWITCH TO OTC ORTHOTICS   RETURN IN 6 WEEKS CHECK FOOT [name]   CONTINUE PT SHOULDER in

## 2013-01-07 NOTE — Patient Instructions (Signed)
[facility]

## 2013-02-18 ENCOUNTER — Ambulatory Visit (INDEPENDENT_AMBULATORY_CARE_PROVIDER_SITE_OTHER): Payer: Medicare Other | Admitting: Orthopedic Surgery

## 2013-02-18 VITALS — BP 120/64 | Ht 65.0 in | Wt 185.0 lb

## 2013-02-18 DIAGNOSIS — M47812 Spondylosis without myelopathy or radiculopathy, cervical region: Secondary | ICD-10-CM

## 2013-02-18 NOTE — Progress Notes (Signed)
Patient ID: Loretta Barnett, female   DOB: [birth year], 72 y.o.   MRN: [id number] Chief complaint followup status post physical therapy for cervical spine pain and shoulder pain and also followup status post treatment for posterior tibial tendon dysfunction, including orthotic management  Rotator cuff syndrome of left shoulder  Posterior tibial tendon dysfunction  Cervical spondylosis without myelopathy   The patient reports she has improved significantly she can turn her neck she does not have any shoulder pain she has improved range of motion in the shoulder. She says her posterior tibial tendon and foot and ankle have improved she is ambulating with minimal discomfort  Review of systems negative  Examination shows improved her cervical range of motion no tenderness over the shoulder full range of motion left shoulder shoulder stable motor exam in her upper extremities normal grade 5 strength normal reflexes skin intact good sensation in both distal extremities are normal pulses  Impression rotator cuff syndrome stable Posterior tibial tendon dysfunction improved Cervical spine spondylosis Followup as needed

## 2013-02-18 NOTE — Patient Instructions (Addendum)
Activities as tolerated Home exercise program

## 2013-06-14 ENCOUNTER — Other Ambulatory Visit (HOSPITAL_COMMUNITY): Payer: Self-pay | Admitting: Family Medicine

## 2013-06-14 DIAGNOSIS — M25512 Pain in left shoulder: Secondary | ICD-10-CM

## 2013-06-15 ENCOUNTER — Other Ambulatory Visit (HOSPITAL_COMMUNITY): Payer: Self-pay | Admitting: Family Medicine

## 2013-06-15 DIAGNOSIS — M25512 Pain in left shoulder: Secondary | ICD-10-CM

## 2013-06-17 ENCOUNTER — Ambulatory Visit (HOSPITAL_COMMUNITY)
Admission: RE | Admit: 2013-06-17 | Discharge: 2013-06-17 | Disposition: A | Discharge status: Home / Self Care | Payer: Medicare Other | Source: Ambulatory Visit | Attending: Family Medicine | Admitting: Family Medicine

## 2013-06-17 ENCOUNTER — Encounter (HOSPITAL_COMMUNITY): Payer: Self-pay

## 2013-06-17 ENCOUNTER — Ambulatory Visit (HOSPITAL_COMMUNITY): Payer: Medicare Other

## 2013-06-17 DIAGNOSIS — M25512 Pain in left shoulder: Secondary | ICD-10-CM

## 2013-06-17 DIAGNOSIS — M719 Bursopathy, unspecified: Secondary | ICD-10-CM | POA: Insufficient documentation

## 2013-06-17 DIAGNOSIS — M6790 Unspecified disorder of synovium and tendon, unspecified site: Secondary | ICD-10-CM | POA: Insufficient documentation

## 2013-06-17 DIAGNOSIS — M25519 Pain in unspecified shoulder: Secondary | ICD-10-CM | POA: Insufficient documentation

## 2013-06-17 DIAGNOSIS — M5380 Other specified dorsopathies, site unspecified: Secondary | ICD-10-CM | POA: Insufficient documentation

## 2013-06-17 DIAGNOSIS — M47812 Spondylosis without myelopathy or radiculopathy, cervical region: Secondary | ICD-10-CM | POA: Insufficient documentation

## 2013-06-17 DIAGNOSIS — M19019 Primary osteoarthritis, unspecified shoulder: Secondary | ICD-10-CM | POA: Insufficient documentation

## 2013-06-17 DIAGNOSIS — M5030 Other cervical disc degeneration, unspecified cervical region: Secondary | ICD-10-CM | POA: Insufficient documentation

## 2013-06-17 DIAGNOSIS — R29890 Loss of height: Secondary | ICD-10-CM | POA: Insufficient documentation

## 2013-06-17 DIAGNOSIS — M542 Cervicalgia: Secondary | ICD-10-CM | POA: Insufficient documentation

## 2013-12-21 ENCOUNTER — Encounter: Payer: Self-pay | Admitting: Internal Medicine

## 2014-01-03 ENCOUNTER — Encounter: Payer: Self-pay | Admitting: Internal Medicine

## 2014-02-09 ENCOUNTER — Ambulatory Visit (AMBULATORY_SURGERY_CENTER): Payer: Medicare Other | Admitting: *Deleted

## 2014-02-09 VITALS — Ht 65.0 in | Wt 183.0 lb

## 2014-02-09 DIAGNOSIS — Z800 Family history of malignant neoplasm of digestive organs: Secondary | ICD-10-CM

## 2014-02-09 DIAGNOSIS — Z1211 Encounter for screening for malignant neoplasm of colon: Secondary | ICD-10-CM

## 2014-02-09 MED ORDER — MOVIPREP 100 G PO SOLR: ORAL | Status: DC

## 2014-02-09 NOTE — Progress Notes (Signed)
No allergies to eggs or soy. No problems with anesthesia.  

## 2014-02-17 ENCOUNTER — Ambulatory Visit (AMBULATORY_SURGERY_CENTER): Payer: Medicare Other | Admitting: Internal Medicine

## 2014-02-17 ENCOUNTER — Encounter: Payer: Self-pay | Admitting: Internal Medicine

## 2014-02-17 VITALS — BP 161/89 | HR 74 | Temp 97.4°F | Resp 14 | Ht 65.0 in | Wt 183.0 lb

## 2014-02-17 DIAGNOSIS — Z1211 Encounter for screening for malignant neoplasm of colon: Secondary | ICD-10-CM

## 2014-02-17 DIAGNOSIS — Z800 Family history of malignant neoplasm of digestive organs: Secondary | ICD-10-CM

## 2014-02-17 MED ORDER — SODIUM CHLORIDE 0.9 % IV SOLN: 500.0000 mL | INTRAVENOUS | Status: DC

## 2014-02-17 NOTE — Progress Notes (Signed)
Propofol given over incremental dosages 

## 2014-02-17 NOTE — Op Note (Signed)
[place]  [employer]. [place], [address]   COLONOSCOPY PROCEDURE REPORT  PATIENT: Loretta Barnett, Loretta Barnett  MR#: [id number] BIRTHDATE: May 29, 1941 , 19  yrs. old GENDER: Female ENDOSCOPIST: Eustace Quail, MD REFERRED [id number] Recall, PROCEDURE DATE:  02/17/2014 PROCEDURE:   Colonoscopy, screening First Screening Colonoscopy - Avg.  risk and is 50 yrs.  old or older - No.  Prior Negative Screening - Now for repeat screening. N/A  History of Adenoma - Now for follow-up colonoscopy & has been > or = to 3 yrs.  N/A  Polyps Removed Today? No.  Recommend repeat exam, <10 yrs? Yes.  High risk (family or personal hx). ASA CLASS:   Class II INDICATIONS:elevated risk screening. a pparent with colorectal cancer  . Last exam January 2010 (negative) MEDICATIONS: MAC sedation, administered by CRNA and propofol (Diprivan) 100mg  IV  DESCRIPTION OF PROCEDURE:   After the risks benefits and alternatives of the procedure were thoroughly explained, informed consent was obtained.  A digital rectal exam revealed no abnormalities of the rectum.   The [id number]  endoscope was introduced through the anus and advanced to the cecum, which was identified by both the appendix and ileocecal valve. No adverse events experienced.   The quality of the prep was excellent, using MoviPrep  The instrument was then slowly withdrawn as the colon was fully examined.      COLON FINDINGS: A normal appearing cecum, ileocecal valve, and appendiceal orifice were identified.  The ascending, hepatic flexure, transverse, splenic flexure, descending, sigmoid colon and rectum appeared unremarkable.  No polyps or cancers were seen. Retroflexed views revealed no abnormalities. The time to cecum=4 minutes 07 seconds.  Withdrawal time=8 minutes 40 seconds.  The scope was withdrawn and the procedure completed. COMPLICATIONS: There were no complications.  ENDOSCOPIC IMPRESSION: Normal  colon  RECOMMENDATIONS: Follow up colonoscopy in 5 years(family history)   eSigned:  Eustace Quail, MD 02/17/2014 10:55 AM   cc: The Patient    ; Lynnell Grain

## 2014-02-17 NOTE — Patient Instructions (Signed)
Discharge instructions given with verbal understanding. Normal exam. Resume previous medications. YOU HAD AN ENDOSCOPIC PROCEDURE TODAY AT THE  ENDOSCOPY CENTER: Refer to the procedure report that was given to you for any specific questions about what was found during the examination.  If the procedure report does not answer your questions, please call your gastroenterologist to clarify.  If you requested that your care partner not be given the details of your procedure findings, then the procedure report has been included in a sealed envelope for you to review at your convenience later.  YOU SHOULD EXPECT: Some feelings of bloating in the abdomen. Passage of more gas than usual.  Walking can help get rid of the air that was put into your GI tract during the procedure and reduce the bloating. If you had a lower endoscopy (such as a colonoscopy or flexible sigmoidoscopy) you may notice spotting of blood in your stool or on the toilet paper. If you underwent a bowel prep for your procedure, then you may not have a normal bowel movement for a few days.  DIET: Your first meal following the procedure should be a light meal and then it is ok to progress to your normal diet.  A half-sandwich or bowl of soup is an example of a good first meal.  Heavy or fried foods are harder to digest and may make you feel nauseous or bloated.  Likewise meals heavy in dairy and vegetables can cause extra gas to form and this can also increase the bloating.  Drink plenty of fluids but you should avoid alcoholic beverages for 24 hours.  ACTIVITY: Your care partner should take you home directly after the procedure.  You should plan to take it easy, moving slowly for the rest of the day.  You can resume normal activity the day after the procedure however you should NOT DRIVE or use heavy machinery for 24 hours (because of the sedation medicines used during the test).    SYMPTOMS TO REPORT IMMEDIATELY: A gastroenterologist  can be reached at any hour.  During normal business hours, 8:30 AM to 5:00 PM Monday through Friday, call [phone].  After hours and on weekends, please call the GI answering service at [phone] who will take a message and have the physician on call contact you.   Following lower endoscopy (colonoscopy or flexible sigmoidoscopy):  Excessive amounts of blood in the stool  Significant tenderness or worsening of abdominal pains  Swelling of the abdomen that is new, acute  Fever of 100F or higher  FOLLOW UP: If any biopsies were taken you will be contacted by phone or by letter within the next 1-3 weeks.  Call your gastroenterologist if you have not heard about the biopsies in 3 weeks.  Our staff will call the home number listed on your records the next business day following your procedure to check on you and address any questions or concerns that you may have at that time regarding the information given to you following your procedure. This is a courtesy call and so if there is no answer at the home number and we have not heard from you through the emergency physician on call, we will assume that you have returned to your regular daily activities without incident.  SIGNATURES/CONFIDENTIALITY: You and/or your care partner have signed paperwork which will be entered into your electronic medical record.  These signatures attest to the fact that that the information above on your After Visit Summary has been reviewed   and is understood.  Full responsibility of the confidentiality of this discharge information lies with you and/or your care-partner. 

## 2014-02-18 ENCOUNTER — Telehealth: Payer: Self-pay

## 2014-02-18 NOTE — Telephone Encounter (Signed)
  Follow up Call-  Call back number 02/17/2014  Post procedure Call Back phone  # [phone]  Permission to leave phone message Yes     Patient questions:  Do you have a fever, pain , or abdominal swelling? no Pain Score  0 *  Have you tolerated food without any problems? yes  Have you been able to return to your normal activities? yes  Do you have any questions about your discharge instructions: Diet   no Medications  no Follow up visit  no  Do you have questions or concerns about your Care? no  Actions: * If pain score is 4 or above: No action needed, pain <4.

## 2014-05-25 ENCOUNTER — Telehealth: Payer: Self-pay | Admitting: Internal Medicine

## 2014-05-25 NOTE — Telephone Encounter (Signed)
Pt states she has been constipated since her colonoscopy. Pt states she has been taking fiber supplements and senna but no results. Suggested pt try miralax. Pt states she will try this but she wants to be seen, states this is not normal for her. Pt scheduled to see Dr. Henrene Pastor 05/31/14@2 :30pm. Pt aware of appt.

## 2014-05-31 ENCOUNTER — Ambulatory Visit (INDEPENDENT_AMBULATORY_CARE_PROVIDER_SITE_OTHER): Payer: Medicare Other | Admitting: Internal Medicine

## 2014-05-31 ENCOUNTER — Encounter: Payer: Self-pay | Admitting: Internal Medicine

## 2014-05-31 VITALS — BP 124/68 | HR 76 | Ht 63.5 in | Wt 188.2 lb

## 2014-05-31 DIAGNOSIS — R141 Gas pain: Secondary | ICD-10-CM

## 2014-05-31 DIAGNOSIS — R142 Eructation: Secondary | ICD-10-CM

## 2014-05-31 DIAGNOSIS — G8929 Other chronic pain: Secondary | ICD-10-CM

## 2014-05-31 DIAGNOSIS — K219 Gastro-esophageal reflux disease without esophagitis: Secondary | ICD-10-CM

## 2014-05-31 DIAGNOSIS — R1011 Right upper quadrant pain: Secondary | ICD-10-CM

## 2014-05-31 DIAGNOSIS — R143 Flatulence: Secondary | ICD-10-CM

## 2014-05-31 DIAGNOSIS — K5900 Constipation, unspecified: Secondary | ICD-10-CM

## 2014-05-31 MED ORDER — PSYLLIUM 28 % PO PACK: 1.0000 | PACK | Freq: Two times a day (BID) | ORAL | Status: DC

## 2014-05-31 NOTE — Progress Notes (Signed)
HISTORY OF PRESENT ILLNESS:  Loretta Barnett is a 73 y.o. female with long-standing diabetes mellitus, hypertension, arthritis, obesity, GERD, and a family history of colon cancer. The patient was last seen 02/17/2014 when she underwent routine followup screening colonoscopy. The examination was normal. Followup in 5 years (family history) recommended. She presents today with a chief complaint of constipation. She states that she has noticed this ever since her colonoscopy. She describes her stools to be ball-like. She contacted the office last week and MiraLax was recommended. She did take a few doses which provided bowel movements, though not bulky. Has not used MiraLax in several days. She also complains of intermittent sharp right upper quadrant pain. Identical to pain complained about March 2013. Also some more vague midabdominal discomfort associated with constipation and relieved with defecation. No weight loss or other issues. She does mention belching, bloating, and increased flatus.  REVIEW OF SYSTEMS:  All non-GI ROS negative except for sinus and allergy trouble, arthritis  Past Medical History  Diagnosis Date  . Arthritis   . Seasonal allergies   . Diabetes mellitus   . Hypertension   . GERD (gastroesophageal reflux disease)   . Obesity   . Colon polyp     Past Surgical History  Procedure Laterality Date  . Leg surgery  1998    left leg and ankle fracture  . Tonsillectomy and adenoidectomy  1948  . Tubal ligation  1972  . Knee arthroscopy Right   . Eye surgery Bilateral 2008    for macular holes; had lens implants    Social History Loretta Barnett  reports that she has never smoked. She has never used smokeless tobacco. She reports that she does not drink alcohol or use illicit drugs.  family history includes Arthritis in her mother; Colon cancer (age of onset: 74) in her father; Diabetes in her father; Pancreatic cancer in her father.  Allergies  Allergen Reactions  .  Codeine     REACTION: nausea/vomiting  . Lidocaine     REACTION: wheezing  . Propoxyphene N-Acetaminophen     REACTION: nausea/vomiting       PHYSICAL EXAMINATION: Vital signs: BP 124/68  Pulse 76  Ht 5' 3.5" (1.613 m)  Wt 188 lb 4 oz (85.39 kg)  BMI 32.82 kg/m2 General: Well-developed, obese, well-nourished, no acute distress HEENT: Sclerae are anicteric, conjunctiva pink. Oral mucosa intact Lungs: Clear Heart: Regular Abdomen: soft, obese, nontender, nondistended, no obvious ascites, no peritoneal signs, normal bowel sounds. No organomegaly. Extremities: No edema Psychiatric: alert and oriented x3. Cooperative    ASSESSMENT:  #1. Functional constipation #2. Mild abdominal discomfort associated with constipation #3. Intermittent sharp right upper quadrant pain. Chronic. Muscular skeletal nature #4. Family history of colon cancer. Negative colonoscopy March 2015 #5. Increased intestinal gas #6. GERD. Asymptomatic on PPI. Prior negative EGD remotely #7. Multiple medical problems   PLAN:  #1. Daily fiber supplementation with Metamucil 2 tablespoons #2. Add MiraLax daily as needed #3. Reflux precautions with attention to weight loss #4. Continue PPI to control GERD symptoms #5. GI followup as needed

## 2014-05-31 NOTE — Patient Instructions (Signed)
Take 2 tablespoons of Metamucil mixed in 14 ounces of juice or water daily.  If you try this for a few weeks and don't see improvement, begin using Miralax.  Please follow up as needed

## 2014-08-09 ENCOUNTER — Ambulatory Visit (HOSPITAL_COMMUNITY)
Admission: RE | Admit: 2014-08-09 | Discharge: 2014-08-09 | Disposition: A | Discharge status: Home / Self Care | Payer: Medicare Other | Source: Ambulatory Visit | Attending: Family Medicine | Admitting: Family Medicine

## 2014-08-09 ENCOUNTER — Other Ambulatory Visit (HOSPITAL_COMMUNITY): Payer: Self-pay | Admitting: Family Medicine

## 2014-08-09 DIAGNOSIS — M7501 Adhesive capsulitis of right shoulder: Secondary | ICD-10-CM

## 2014-08-09 DIAGNOSIS — M25519 Pain in unspecified shoulder: Secondary | ICD-10-CM | POA: Diagnosis present

## 2014-08-09 DIAGNOSIS — M7530 Calcific tendinitis of unspecified shoulder: Secondary | ICD-10-CM | POA: Diagnosis not present

## 2015-02-17 ENCOUNTER — Other Ambulatory Visit (HOSPITAL_COMMUNITY): Payer: Self-pay | Admitting: Family Medicine

## 2015-02-17 DIAGNOSIS — R1030 Lower abdominal pain, unspecified: Secondary | ICD-10-CM

## 2015-02-22 ENCOUNTER — Ambulatory Visit (HOSPITAL_COMMUNITY)
Admission: RE | Admit: 2015-02-22 | Discharge: 2015-02-22 | Disposition: A | Discharge status: Home / Self Care | Payer: Medicare Other | Source: Ambulatory Visit | Attending: Family Medicine | Admitting: Family Medicine

## 2015-02-22 DIAGNOSIS — R1010 Upper abdominal pain, unspecified: Secondary | ICD-10-CM | POA: Diagnosis not present

## 2015-02-22 DIAGNOSIS — R1030 Lower abdominal pain, unspecified: Secondary | ICD-10-CM

## 2015-03-24 ENCOUNTER — Emergency Department (HOSPITAL_COMMUNITY): Payer: No Typology Code available for payment source

## 2015-03-24 ENCOUNTER — Inpatient Hospital Stay (HOSPITAL_COMMUNITY)
Admission: EM | Admit: 2015-03-24 | Discharge: 2015-03-31 | DRG: 493 | Disposition: A | Discharge status: Home Health | Payer: No Typology Code available for payment source | Attending: General Surgery | Admitting: General Surgery

## 2015-03-24 ENCOUNTER — Encounter (HOSPITAL_COMMUNITY): Payer: Self-pay | Admitting: Emergency Medicine

## 2015-03-24 DIAGNOSIS — Z7982 Long term (current) use of aspirin: Secondary | ICD-10-CM

## 2015-03-24 DIAGNOSIS — E1142 Type 2 diabetes mellitus with diabetic polyneuropathy: Secondary | ICD-10-CM | POA: Diagnosis present

## 2015-03-24 DIAGNOSIS — M79642 Pain in left hand: Secondary | ICD-10-CM | POA: Diagnosis present

## 2015-03-24 DIAGNOSIS — I10 Essential (primary) hypertension: Secondary | ICD-10-CM | POA: Diagnosis present

## 2015-03-24 DIAGNOSIS — S2231XA Fracture of one rib, right side, initial encounter for closed fracture: Secondary | ICD-10-CM | POA: Diagnosis present

## 2015-03-24 DIAGNOSIS — S8991XA Unspecified injury of right lower leg, initial encounter: Secondary | ICD-10-CM

## 2015-03-24 DIAGNOSIS — S2249XA Multiple fractures of ribs, unspecified side, initial encounter for closed fracture: Secondary | ICD-10-CM | POA: Diagnosis not present

## 2015-03-24 DIAGNOSIS — S62325A Displaced fracture of shaft of fourth metacarpal bone, left hand, initial encounter for closed fracture: Principal | ICD-10-CM | POA: Diagnosis present

## 2015-03-24 DIAGNOSIS — E119 Type 2 diabetes mellitus without complications: Secondary | ICD-10-CM | POA: Diagnosis present

## 2015-03-24 DIAGNOSIS — M1711 Unilateral primary osteoarthritis, right knee: Secondary | ICD-10-CM | POA: Diagnosis present

## 2015-03-24 DIAGNOSIS — K219 Gastro-esophageal reflux disease without esophagitis: Secondary | ICD-10-CM | POA: Diagnosis present

## 2015-03-24 DIAGNOSIS — T148XXA Other injury of unspecified body region, initial encounter: Secondary | ICD-10-CM

## 2015-03-24 DIAGNOSIS — S62305A Unspecified fracture of fourth metacarpal bone, left hand, initial encounter for closed fracture: Secondary | ICD-10-CM | POA: Diagnosis present

## 2015-03-24 DIAGNOSIS — E559 Vitamin D deficiency, unspecified: Secondary | ICD-10-CM | POA: Diagnosis present

## 2015-03-24 DIAGNOSIS — S82143A Displaced bicondylar fracture of unspecified tibia, initial encounter for closed fracture: Secondary | ICD-10-CM | POA: Diagnosis present

## 2015-03-24 DIAGNOSIS — E876 Hypokalemia: Secondary | ICD-10-CM | POA: Diagnosis not present

## 2015-03-24 DIAGNOSIS — S82141A Displaced bicondylar fracture of right tibia, initial encounter for closed fracture: Secondary | ICD-10-CM | POA: Diagnosis present

## 2015-03-24 DIAGNOSIS — S62305D Unspecified fracture of fourth metacarpal bone, left hand, subsequent encounter for fracture with routine healing: Secondary | ICD-10-CM | POA: Diagnosis not present

## 2015-03-24 DIAGNOSIS — D62 Acute posthemorrhagic anemia: Secondary | ICD-10-CM | POA: Diagnosis present

## 2015-03-24 DIAGNOSIS — M7989 Other specified soft tissue disorders: Secondary | ICD-10-CM

## 2015-03-24 DIAGNOSIS — S2242XA Multiple fractures of ribs, left side, initial encounter for closed fracture: Secondary | ICD-10-CM | POA: Diagnosis present

## 2015-03-24 DIAGNOSIS — M1712 Unilateral primary osteoarthritis, left knee: Secondary | ICD-10-CM | POA: Diagnosis present

## 2015-03-24 DIAGNOSIS — S4992XA Unspecified injury of left shoulder and upper arm, initial encounter: Secondary | ICD-10-CM

## 2015-03-24 HISTORY — DX: Adverse effect of unspecified anesthetic, initial encounter: T41.45XA

## 2015-03-24 HISTORY — DX: Other specified postprocedural states: R11.2

## 2015-03-24 HISTORY — DX: Other specified postprocedural states: Z98.890

## 2015-03-24 HISTORY — DX: Other complications of anesthesia, initial encounter: T88.59XA

## 2015-03-24 LAB — CBC
HCT: 35.3 % — ABNORMAL LOW (ref 36.0–46.0)
Hemoglobin: 10.9 g/dL — ABNORMAL LOW (ref 12.0–15.0)
MCH: 26.7 pg (ref 26.0–34.0)
MCHC: 30.9 g/dL (ref 30.0–36.0)
MCV: 86.5 fL (ref 78.0–100.0)
PLATELETS: 316 10*3/uL (ref 150–400)
RBC: 4.08 MIL/uL (ref 3.87–5.11)
RDW: 13.1 % (ref 11.5–15.5)
WBC: 18.5 10*3/uL — AB (ref 4.0–10.5)

## 2015-03-24 LAB — COMPREHENSIVE METABOLIC PANEL
ALT: 24 U/L (ref 0–35)
AST: 50 U/L — AB (ref 0–37)
Albumin: 3.7 g/dL (ref 3.5–5.2)
Alkaline Phosphatase: 81 U/L (ref 39–117)
Anion gap: 15 (ref 5–15)
BILIRUBIN TOTAL: 0.6 mg/dL (ref 0.3–1.2)
BUN: 17 mg/dL (ref 6–23)
CHLORIDE: 99 mmol/L (ref 96–112)
CO2: 25 mmol/L (ref 19–32)
Calcium: 9.3 mg/dL (ref 8.4–10.5)
Creatinine, Ser: 1.02 mg/dL (ref 0.50–1.10)
GFR calc Af Amer: 63 mL/min — ABNORMAL LOW (ref >90)
GFR calc non Af Amer: 54 mL/min — ABNORMAL LOW (ref >90)
Glucose, Bld: 175 mg/dL — ABNORMAL HIGH (ref 70–99)
Potassium: 2.8 mmol/L — ABNORMAL LOW (ref 3.5–5.1)
SODIUM: 139 mmol/L (ref 135–145)
Total Protein: 7.6 g/dL (ref 6.0–8.3)

## 2015-03-24 LAB — CDS SEROLOGY

## 2015-03-24 LAB — SAMPLE TO BLOOD BANK

## 2015-03-24 LAB — PROTIME-INR
INR: 1.01 (ref 0.00–1.49)
Prothrombin Time: 13.4 seconds (ref 11.6–15.2)

## 2015-03-24 LAB — ETHANOL: Alcohol, Ethyl (B): <5 mg/dL (ref 0–9)

## 2015-03-24 MED ORDER — FENTANYL CITRATE 0.05 MG/ML IJ SOLN
25.0000 ug | Freq: Once | INTRAMUSCULAR | Status: AC
  Administered 2015-03-24: 25 ug via INTRAVENOUS

## 2015-03-24 MED ORDER — POTASSIUM CHLORIDE 10 MEQ/100ML IV SOLN
10.0000 meq | INTRAVENOUS | Status: AC
  Administered 2015-03-24: 10 meq via INTRAVENOUS
  Filled 2015-03-24: qty 100

## 2015-03-24 MED ORDER — IOHEXOL 300 MG/ML  SOLN
100.0000 mL | Freq: Once | INTRAMUSCULAR | Status: AC | PRN
  Administered 2015-03-24: 100 mL via INTRAVENOUS

## 2015-03-24 MED ORDER — FENTANYL CITRATE 0.05 MG/ML IJ SOLN
INTRAMUSCULAR | Status: AC
  Filled 2015-03-24: qty 2

## 2015-03-24 MED ORDER — TETANUS-DIPHTH-ACELL PERTUSSIS 5-2.5-18.5 LF-MCG/0.5 IM SUSP
0.5000 mL | Freq: Once | INTRAMUSCULAR | Status: AC
  Administered 2015-03-24: 0.5 mL via INTRAMUSCULAR
  Filled 2015-03-24: qty 0.5
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
## 2015-03-25 ENCOUNTER — Inpatient Hospital Stay (HOSPITAL_COMMUNITY): Payer: No Typology Code available for payment source

## 2015-03-25 DIAGNOSIS — S2249XA Multiple fractures of ribs, unspecified side, initial encounter for closed fracture: Secondary | ICD-10-CM | POA: Diagnosis present

## 2015-03-25 LAB — GLUCOSE, CAPILLARY
GLUCOSE-CAPILLARY: 159 mg/dL — AB (ref 70–99)
GLUCOSE-CAPILLARY: 155 mg/dL — AB (ref 70–99)
Glucose-Capillary: 163 mg/dL — ABNORMAL HIGH (ref 70–99)
Glucose-Capillary: 184 mg/dL — ABNORMAL HIGH (ref 70–99)

## 2015-03-25 LAB — BASIC METABOLIC PANEL
ANION GAP: 11 (ref 5–15)
Anion gap: 14 (ref 5–15)
BUN: 14 mg/dL (ref 6–23)
BUN: 14 mg/dL (ref 6–23)
CO2: 22 mmol/L (ref 19–32)
CO2: 25 mmol/L (ref 19–32)
Calcium: 8.9 mg/dL (ref 8.4–10.5)
Calcium: 8.7 mg/dL (ref 8.4–10.5)
Chloride: 100 mmol/L (ref 96–112)
Chloride: 100 mmol/L (ref 96–112)
Creatinine, Ser: 0.95 mg/dL (ref 0.50–1.10)
Creatinine, Ser: 1.05 mg/dL (ref 0.50–1.10)
GFR, EST AFRICAN AMERICAN: 68 mL/min — AB (ref >90)
GFR, EST AFRICAN AMERICAN: 60 mL/min — AB (ref >90)
GFR, EST NON AFRICAN AMERICAN: 59 mL/min — AB (ref >90)
GFR, EST NON AFRICAN AMERICAN: 52 mL/min — AB (ref >90)
GLUCOSE: 192 mg/dL — AB (ref 70–99)
Glucose, Bld: 170 mg/dL — ABNORMAL HIGH (ref 70–99)
Potassium: 3.7 mmol/L (ref 3.5–5.1)
Potassium: 3.6 mmol/L (ref 3.5–5.1)
SODIUM: 136 mmol/L (ref 135–145)
Sodium: 136 mmol/L (ref 135–145)

## 2015-03-25 LAB — MRSA PCR SCREENING: MRSA BY PCR: NEGATIVE

## 2015-03-25 LAB — CBC
HCT: 29.2 % — ABNORMAL LOW (ref 36.0–46.0)
HEMOGLOBIN: 9.3 g/dL — AB (ref 12.0–15.0)
MCH: 27.1 pg (ref 26.0–34.0)
MCHC: 31.8 g/dL (ref 30.0–36.0)
MCV: 85.1 fL (ref 78.0–100.0)
Platelets: 291 10*3/uL (ref 150–400)
RBC: 3.43 MIL/uL — ABNORMAL LOW (ref 3.87–5.11)
RDW: 13.3 % (ref 11.5–15.5)
WBC: 9.1 10*3/uL (ref 4.0–10.5)

## 2015-03-25 MED ORDER — DICLOFENAC SODIUM 1 % TD GEL: 2.0000 g | TRANSDERMAL | Status: DC | PRN

## 2015-03-25 MED ORDER — POTASSIUM CHLORIDE 10 MEQ/100ML IV SOLN
10.0000 meq | Freq: Once | INTRAVENOUS | Status: AC
  Administered 2015-03-25: 10 meq via INTRAVENOUS
  Filled 2015-03-25: qty 100

## 2015-03-25 MED ORDER — LORATADINE 10 MG PO TABS
10.0000 mg | ORAL_TABLET | Freq: Every day | ORAL | Status: DC
  Administered 2015-03-25 – 2015-03-31 (×7): 10 mg via ORAL
  Filled 2015-03-25 (×7): qty 1

## 2015-03-25 MED ORDER — LOSARTAN POTASSIUM-HCTZ 100-25 MG PO TABS
1.0000 | ORAL_TABLET | Freq: Every day | ORAL | Status: DC
Start: 2015-03-25 — End: 2015-03-25

## 2015-03-25 MED ORDER — DOCUSATE SODIUM 100 MG PO CAPS
100.0000 mg | ORAL_CAPSULE | Freq: Two times a day (BID) | ORAL | Status: DC
  Administered 2015-03-25 – 2015-03-31 (×12): 100 mg via ORAL
  Filled 2015-03-25 (×13): qty 1

## 2015-03-25 MED ORDER — MORPHINE SULFATE 2 MG/ML IJ SOLN: 1.0000 mg | INTRAMUSCULAR | Status: DC | PRN

## 2015-03-25 MED ORDER — POTASSIUM CHLORIDE 10 MEQ/100ML IV SOLN
10.0000 meq | INTRAVENOUS | Status: AC
  Administered 2015-03-25 (×5): 10 meq via INTRAVENOUS
  Filled 2015-03-25 (×5): qty 100

## 2015-03-25 MED ORDER — OXYCODONE HCL 5 MG PO TABS: 5.0000 mg | ORAL_TABLET | ORAL | Status: DC | PRN

## 2015-03-25 MED ORDER — ONDANSETRON HCL 4 MG/2ML IJ SOLN: 4.0000 mg | Freq: Three times a day (TID) | INTRAMUSCULAR | Status: DC | PRN

## 2015-03-25 MED ORDER — ACETAMINOPHEN 325 MG PO TABS
650.0000 mg | ORAL_TABLET | ORAL | Status: DC | PRN
  Administered 2015-03-25 – 2015-03-26 (×5): 650 mg via ORAL
  Filled 2015-03-25 (×6): qty 2

## 2015-03-25 MED ORDER — PANTOPRAZOLE SODIUM 40 MG PO TBEC
40.0000 mg | DELAYED_RELEASE_TABLET | Freq: Every day | ORAL | Status: DC
  Administered 2015-03-25 – 2015-03-31 (×7): 40 mg via ORAL
  Filled 2015-03-25 (×7): qty 1

## 2015-03-25 MED ORDER — INSULIN ASPART 100 UNIT/ML ~~LOC~~ SOLN
0.0000 [IU] | Freq: Three times a day (TID) | SUBCUTANEOUS | Status: DC
  Administered 2015-03-25 (×3): 2 [IU] via SUBCUTANEOUS
  Administered 2015-03-26: 1 [IU] via SUBCUTANEOUS
  Administered 2015-03-26: 2 [IU] via SUBCUTANEOUS
  Administered 2015-03-26: 1 [IU] via SUBCUTANEOUS
  Administered 2015-03-27: 3 [IU] via SUBCUTANEOUS
  Administered 2015-03-27: 1 [IU] via SUBCUTANEOUS
  Administered 2015-03-29: 2 [IU] via SUBCUTANEOUS
  Administered 2015-03-29: 1 [IU] via SUBCUTANEOUS
  Administered 2015-03-29: 3 [IU] via SUBCUTANEOUS
  Administered 2015-03-30 – 2015-03-31 (×5): 2 [IU] via SUBCUTANEOUS

## 2015-03-25 MED ORDER — SODIUM CHLORIDE 0.9 % IV SOLN: INTRAVENOUS | Status: AC

## 2015-03-25 MED ORDER — HYDROCHLOROTHIAZIDE 25 MG PO TABS
25.0000 mg | ORAL_TABLET | Freq: Every day | ORAL | Status: DC
  Administered 2015-03-25 – 2015-03-31 (×7): 25 mg via ORAL
  Filled 2015-03-25 (×7): qty 1

## 2015-03-25 MED ORDER — ONDANSETRON HCL 4 MG/2ML IJ SOLN
4.0000 mg | Freq: Four times a day (QID) | INTRAMUSCULAR | Status: DC | PRN
  Administered 2015-03-28: 4 mg via INTRAVENOUS
  Filled 2015-03-25: qty 2

## 2015-03-25 MED ORDER — ONDANSETRON HCL 4 MG PO TABS: 4.0000 mg | ORAL_TABLET | Freq: Four times a day (QID) | ORAL | Status: DC | PRN

## 2015-03-25 MED ORDER — BISACODYL 10 MG RE SUPP: 10.0000 mg | Freq: Every day | RECTAL | Status: DC | PRN

## 2015-03-25 MED ORDER — LOSARTAN POTASSIUM 50 MG PO TABS
100.0000 mg | ORAL_TABLET | Freq: Every day | ORAL | Status: DC
  Administered 2015-03-25 – 2015-03-31 (×7): 100 mg via ORAL
  Filled 2015-03-25 (×8): qty 2

## 2015-03-25 MED ORDER — KCL IN DEXTROSE-NACL 20-5-0.45 MEQ/L-%-% IV SOLN
INTRAVENOUS | Status: DC
  Administered 2015-03-25: 03:00:00 via INTRAVENOUS
  Filled 2015-03-25 (×6): qty 1000
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
## 2015-03-25 NOTE — Progress Notes (Signed)
   Subjective: Hospital day - 1 Patient reports pain as moderate.   Patient seen in rounds with Dr. Wynelle Link. Patient is having problems with pain in the leg, requiring pain medications Dr. Doran Durand to speak with Trauma / Dr. [name] to see about taking over care and performing surgery.  Objective: Vital signs in last 24 hours: Temp:  [97.7 F (36.5 C)-98.9 F (37.2 C)] 97.9 F (36.6 C) (04/09 0738) Pulse Rate:  [85-103] 87 (04/09 0800) Resp:  [13-26] 21 (04/09 0800) BP: (123-178)/(58-102) 123/73 mmHg (04/09 0800) SpO2:  [96 %-100 %] 100 % (04/09 0800) Weight:  [88.4 kg (194 lb 14.2 oz)] 88.4 kg (194 lb 14.2 oz) (04/09 0100)  Intake/Output from previous day:  Intake/Output Summary (Last 24 hours) at 03/25/15 0932 Last data filed at 03/25/15 0811  Gross per 24 hour  Intake    900 ml  Output    400 ml  Net    500 ml    Intake/Output this shift: Total I/O In: 200 [I.V.:100; IV Piggyback:100] Out: -   Labs:  Recent Labs  03/24/15 1856 03/25/15 0623  HGB 10.9* 9.3*    Recent Labs  03/24/15 1856 03/25/15 0623  WBC 18.5* 9.1  RBC 4.08 3.43*  HCT 35.3* 29.2*  PLT 316 291    Recent Labs  03/24/15 1856 03/25/15 0623  NA 139 136  K 2.8* 3.7  CL 99 100  CO2 25 22  BUN 17 14  CREATININE 1.02 0.95  GLUCOSE 175* 170*  CALCIUM 9.3 8.9    Recent Labs  03/24/15 1856  INR 1.01    EXAM General - Patient is Alert and Appropriate Extremity - Neurovascular intact Sensation intact distally Compartments soft, minimal swelling noted Motor Function - intact, moving foot and toes well on exam.   Past Medical History  Diagnosis Date  . Arthritis   . Seasonal allergies   . Diabetes mellitus   . Hypertension   . GERD (gastroesophageal reflux disease)   . Obesity   . Colon polyp     Assessment/Plan: Hospital day - 1 Active Problems:   Tibial plateau fracture   Multiple rib fractures involving first rib  Estimated body mass index is 32.95 kg/(m^2) as  calculated from the following:   Height as of this encounter: 5' 4.5" (1.638 m).   Weight as of this encounter: 88.4 kg (194 lb 14.2 oz). Bedrest Dr. Doran Durand to speak with Dr. [name] about surgery. No planned ortho procedure today. NEW to right leg. May eat from an orthopedic standpoint today.  Arlee Muslim, PA-C Orthopaedic Surgery 03/25/2015, 9:32 AM

## 2015-03-26 LAB — GLUCOSE, CAPILLARY
Glucose-Capillary: 150 mg/dL — ABNORMAL HIGH (ref 70–99)
Glucose-Capillary: 136 mg/dL — ABNORMAL HIGH (ref 70–99)
Glucose-Capillary: 164 mg/dL — ABNORMAL HIGH (ref 70–99)

## 2015-03-26 MED ORDER — HEPARIN SODIUM (PORCINE) 5000 UNIT/ML IJ SOLN
5000.0000 [IU] | Freq: Three times a day (TID) | INTRAMUSCULAR | Status: AC
  Administered 2015-03-26 (×2): 5000 [IU] via SUBCUTANEOUS
  Filled 2015-03-26 (×2): qty 1

## 2015-03-26 MED ORDER — POTASSIUM CHLORIDE IN NACL 20-0.9 MEQ/L-% IV SOLN
INTRAVENOUS | Status: DC
  Administered 2015-03-26: 12:00:00 via INTRAVENOUS
  Filled 2015-03-26 (×6): qty 1000

## 2015-03-26 MED ORDER — HEPARIN SODIUM (PORCINE) 5000 UNIT/ML IJ SOLN: 5000.0000 [IU] | Freq: Three times a day (TID) | INTRAMUSCULAR | Status: DC

## 2015-03-26 NOTE — Progress Notes (Signed)
Orthopedic Tech Progress Note Patient Details:  Loretta Barnett June 21, 1941 [id number]  Ortho Devices Type of Ortho Device: Post (short arm) splint, Ace wrap Ortho Device/Splint Interventions: Application   [name] 03/26/2015, 11:25 AM
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
## 2015-03-26 NOTE — Progress Notes (Signed)
Subjective: Pain well controlled. Had 4th metacarpal fracture on x-rays yesterday   Objective: Vital signs in last 24 hours: Temp:  [97.1 F (36.2 C)-98.5 F (36.9 C)] 97.7 F (36.5 C) (04/10 0626) Pulse Rate:  [86-94] 86 (04/10 0626) Resp:  [16-21] 16 (04/10 0626) BP: (130-159)/(58-72) 150/72 mmHg (04/10 0626) SpO2:  [99 %-100 %] 99 % (04/10 0626)  Intake/Output from previous day: 04/09 0701 - 04/10 0700 In: 1040 [P.O.:240; I.V.:600; IV Piggyback:200] Out: 3175 [Urine:3175] Intake/Output this shift:     Recent Labs  03/24/15 1856 03/25/15 0623  HGB 10.9* 9.3*    Recent Labs  03/24/15 1856 03/25/15 0623  WBC 18.5* 9.1  RBC 4.08 3.43*  HCT 35.3* 29.2*  PLT 316 291    Recent Labs  03/25/15 0623 03/25/15 0950  NA 136 136  K 3.7 3.6  CL 100 100  CO2 22 25  BUN 14 14  CREATININE 0.95 1.05  GLUCOSE 170* 192*  CALCIUM 8.9 8.7    Recent Labs  03/24/15 1856  INR 1.01    Neurologically intact Neurovascular intact Compartment soft  Assessment/Plan: Right tibial plateau fracture- Await recs from Dr. [name] Right 4th metacarpal fracture- to get ortho tech to place splint today   Tyrann Donaho V 03/26/2015, 9:09 AM

## 2015-03-27 DIAGNOSIS — S62305A Unspecified fracture of fourth metacarpal bone, left hand, initial encounter for closed fracture: Secondary | ICD-10-CM | POA: Diagnosis present

## 2015-03-27 LAB — CBC
HEMATOCRIT: 29 % — AB (ref 36.0–46.0)
Hemoglobin: 9 g/dL — ABNORMAL LOW (ref 12.0–15.0)
MCH: 26.7 pg (ref 26.0–34.0)
MCHC: 31 g/dL (ref 30.0–36.0)
MCV: 86.1 fL (ref 78.0–100.0)
Platelets: 252 10*3/uL (ref 150–400)
RBC: 3.37 MIL/uL — ABNORMAL LOW (ref 3.87–5.11)
RDW: 13.5 % (ref 11.5–15.5)
WBC: 5.6 10*3/uL (ref 4.0–10.5)

## 2015-03-27 LAB — BASIC METABOLIC PANEL
Anion gap: 4 — ABNORMAL LOW (ref 5–15)
BUN: 13 mg/dL (ref 6–23)
CHLORIDE: 106 mmol/L (ref 96–112)
CO2: 28 mmol/L (ref 19–32)
Calcium: 8.8 mg/dL (ref 8.4–10.5)
Creatinine, Ser: 1.02 mg/dL (ref 0.50–1.10)
GFR, EST AFRICAN AMERICAN: 63 mL/min — AB (ref >90)
GFR, EST NON AFRICAN AMERICAN: 54 mL/min — AB (ref >90)
Glucose, Bld: 146 mg/dL — ABNORMAL HIGH (ref 70–99)
POTASSIUM: 3.8 mmol/L (ref 3.5–5.1)
SODIUM: 138 mmol/L (ref 135–145)

## 2015-03-27 LAB — GLUCOSE, CAPILLARY
GLUCOSE-CAPILLARY: 137 mg/dL — AB (ref 70–99)
GLUCOSE-CAPILLARY: 94 mg/dL (ref 70–99)
Glucose-Capillary: 150 mg/dL — ABNORMAL HIGH (ref 70–99)
Glucose-Capillary: 202 mg/dL — ABNORMAL HIGH (ref 70–99)
Glucose-Capillary: 139 mg/dL — ABNORMAL HIGH (ref 70–99)

## 2015-03-27 LAB — HEMOGLOBIN A1C
Hgb A1c MFr Bld: 7.6 % — ABNORMAL HIGH (ref 4.8–5.6)
MEAN PLASMA GLUCOSE: 171 mg/dL

## 2015-03-27 MED ORDER — BACITRACIN ZINC 500 UNIT/GM EX OINT
TOPICAL_OINTMENT | Freq: Two times a day (BID) | CUTANEOUS | Status: DC
  Administered 2015-03-27 – 2015-03-31 (×8): via TOPICAL
  Filled 2015-03-27: qty 28.35

## 2015-03-27 MED ORDER — FUROSEMIDE 20 MG PO TABS
20.0000 mg | ORAL_TABLET | Freq: Every day | ORAL | Status: DC
  Administered 2015-03-27 – 2015-03-31 (×5): 20 mg via ORAL
  Filled 2015-03-27 (×5): qty 1

## 2015-03-27 MED ORDER — METFORMIN HCL 500 MG PO TABS
500.0000 mg | ORAL_TABLET | Freq: Two times a day (BID) | ORAL | Status: DC
  Administered 2015-03-27 – 2015-03-31 (×8): 500 mg via ORAL
  Filled 2015-03-27 (×8): qty 1

## 2015-03-27 MED ORDER — CEFAZOLIN SODIUM-DEXTROSE 2-3 GM-% IV SOLR
2.0000 g | Freq: Once | INTRAVENOUS | Status: DC
  Filled 2015-03-27: qty 50

## 2015-03-27 MED ORDER — POLYVINYL ALCOHOL 1.4 % OP SOLN
2.0000 [drp] | OPHTHALMIC | Status: DC | PRN
Start: 2015-03-27 — End: 2015-03-31
  Administered 2015-03-27 – 2015-03-31 (×5): 2 [drp] via OPHTHALMIC
  Filled 2015-03-27: qty 15

## 2015-03-27 MED ORDER — TRAMADOL HCL 50 MG PO TABS
50.0000 mg | ORAL_TABLET | Freq: Four times a day (QID) | ORAL | Status: DC | PRN
  Administered 2015-03-29 (×3): 100 mg via ORAL
  Filled 2015-03-27 (×3): qty 2

## 2015-03-27 MED ORDER — MORPHINE SULFATE 2 MG/ML IJ SOLN: 2.0000 mg | INTRAMUSCULAR | Status: DC | PRN

## 2015-03-27 NOTE — Progress Notes (Signed)
UR completed.  Tennie Grussing, RN BSN [facility] CCM Trauma/Neuro ICU Case Manager [phone]  
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
## 2015-03-27 NOTE — Progress Notes (Signed)
OT Cancellation Note  Patient Details Name: Loretta Barnett MRN: [id number] DOB: Nov 03, 1941   Cancelled Treatment:    Reason Eval/Treat Not Completed: Other (comment) Orders acknowledged. Will wait until after surgery to assess.  [place], OTR/L  [phone] 03/27/2015 03/27/2015, 2:29 PM
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
## 2015-03-27 NOTE — Progress Notes (Signed)
Orthopedic Tech Progress Note Patient Details:  Loretta Barnett March 12, 1941 [id number]  Ortho Devices Type of Ortho Device:  (watson jones) Ortho Device/Splint Location: rle Ortho Device/Splint Interventions: Application   Windy Dudek 03/27/2015, 1:04 PM
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
## 2015-03-28 ENCOUNTER — Inpatient Hospital Stay (HOSPITAL_COMMUNITY): Payer: No Typology Code available for payment source

## 2015-03-28 ENCOUNTER — Encounter (HOSPITAL_COMMUNITY): Payer: Self-pay | Admitting: Anesthesiology

## 2015-03-28 ENCOUNTER — Encounter (HOSPITAL_COMMUNITY): Admission: EM | Disposition: A | Discharge status: Home Health | Payer: Self-pay | Source: Home / Self Care

## 2015-03-28 ENCOUNTER — Inpatient Hospital Stay (HOSPITAL_COMMUNITY): Payer: No Typology Code available for payment source | Admitting: Anesthesiology

## 2015-03-28 DIAGNOSIS — D62 Acute posthemorrhagic anemia: Secondary | ICD-10-CM | POA: Diagnosis not present

## 2015-03-28 DIAGNOSIS — D649 Anemia, unspecified: Secondary | ICD-10-CM | POA: Insufficient documentation

## 2015-03-28 HISTORY — PX: INJECTION KNEE: SHX2446

## 2015-03-28 HISTORY — PX: OPEN REDUCTION INTERNAL FIXATION (ORIF) HAND: SHX5991

## 2015-03-28 HISTORY — PX: ORIF TIBIA PLATEAU: SHX2132

## 2015-03-28 LAB — CBC
HCT: 26.7 % — ABNORMAL LOW (ref 36.0–46.0)
Hemoglobin: 8.5 g/dL — ABNORMAL LOW (ref 12.0–15.0)
MCH: 27 pg (ref 26.0–34.0)
MCHC: 31.8 g/dL (ref 30.0–36.0)
MCV: 84.8 fL (ref 78.0–100.0)
Platelets: 256 10*3/uL (ref 150–400)
RBC: 3.15 MIL/uL — ABNORMAL LOW (ref 3.87–5.11)
RDW: 13.5 % (ref 11.5–15.5)
WBC: 5.4 10*3/uL (ref 4.0–10.5)

## 2015-03-28 LAB — TYPE AND SCREEN
ABO/RH(D): B POS
Antibody Screen: NEGATIVE

## 2015-03-28 LAB — GLUCOSE, CAPILLARY
GLUCOSE-CAPILLARY: 130 mg/dL — AB (ref 70–99)
GLUCOSE-CAPILLARY: 120 mg/dL — AB (ref 70–99)
Glucose-Capillary: 138 mg/dL — ABNORMAL HIGH (ref 70–99)
Glucose-Capillary: 121 mg/dL — ABNORMAL HIGH (ref 70–99)
Glucose-Capillary: 177 mg/dL — ABNORMAL HIGH (ref 70–99)

## 2015-03-28 LAB — ABO/RH: ABO/RH(D): B POS

## 2015-03-28 SURGERY — OPEN REDUCTION INTERNAL FIXATION (ORIF) TIBIAL PLATEAU
Anesthesia: General | Site: Leg Lower | Laterality: Right

## 2015-03-28 MED ORDER — ACETAMINOPHEN 325 MG PO TABS
650.0000 mg | ORAL_TABLET | Freq: Four times a day (QID) | ORAL | Status: DC | PRN
  Administered 2015-03-30 (×2): 650 mg via ORAL
  Filled 2015-03-28 (×2): qty 2

## 2015-03-28 MED ORDER — PHENYLEPHRINE HCL 10 MG/ML IJ SOLN
INTRAMUSCULAR | Status: DC | PRN
  Administered 2015-03-28 (×5): 80 ug via INTRAVENOUS
  Administered 2015-03-28: 160 ug via INTRAVENOUS
  Administered 2015-03-28 (×4): 80 ug via INTRAVENOUS

## 2015-03-28 MED ORDER — OXYCODONE HCL 5 MG/5ML PO SOLN: 5.0000 mg | Freq: Once | ORAL | Status: DC | PRN

## 2015-03-28 MED ORDER — HYDROMORPHONE HCL 1 MG/ML IJ SOLN: 0.2500 mg | INTRAMUSCULAR | Status: DC | PRN

## 2015-03-28 MED ORDER — METOCLOPRAMIDE HCL 5 MG/ML IJ SOLN
INTRAMUSCULAR | Status: DC | PRN
  Administered 2015-03-28: 10 mg via INTRAVENOUS

## 2015-03-28 MED ORDER — ENOXAPARIN SODIUM 40 MG/0.4ML ~~LOC~~ SOLN
40.0000 mg | SUBCUTANEOUS | Status: DC
  Administered 2015-03-29 – 2015-03-31 (×3): 40 mg via SUBCUTANEOUS
  Filled 2015-03-28 (×3): qty 0.4

## 2015-03-28 MED ORDER — ACETAMINOPHEN 650 MG RE SUPP: 650.0000 mg | Freq: Four times a day (QID) | RECTAL | Status: DC | PRN

## 2015-03-28 MED ORDER — CEFAZOLIN SODIUM 1-5 GM-% IV SOLN
1.0000 g | Freq: Four times a day (QID) | INTRAVENOUS | Status: AC
  Administered 2015-03-29 (×3): 1 g via INTRAVENOUS
  Filled 2015-03-28 (×3): qty 50

## 2015-03-28 MED ORDER — METHYLPREDNISOLONE ACETATE 80 MG/ML IJ SUSP
INTRAMUSCULAR | Status: AC
  Filled 2015-03-28: qty 1

## 2015-03-28 MED ORDER — LACTATED RINGERS IV SOLN
INTRAVENOUS | Status: DC | PRN
  Administered 2015-03-28: 17:00:00 via INTRAVENOUS

## 2015-03-28 MED ORDER — SUCCINYLCHOLINE CHLORIDE 20 MG/ML IJ SOLN
INTRAMUSCULAR | Status: AC
  Filled 2015-03-28: qty 1

## 2015-03-28 MED ORDER — LIDOCAINE HCL (CARDIAC) 20 MG/ML IV SOLN
INTRAVENOUS | Status: AC
  Filled 2015-03-28: qty 10

## 2015-03-28 MED ORDER — METOCLOPRAMIDE HCL 5 MG/ML IJ SOLN
5.0000 mg | Freq: Three times a day (TID) | INTRAMUSCULAR | Status: DC | PRN
Start: 2015-03-28 — End: 2015-03-31

## 2015-03-28 MED ORDER — ONDANSETRON HCL 4 MG PO TABS: 4.0000 mg | ORAL_TABLET | Freq: Four times a day (QID) | ORAL | Status: DC | PRN

## 2015-03-28 MED ORDER — ONDANSETRON HCL 4 MG/2ML IJ SOLN: 4.0000 mg | Freq: Four times a day (QID) | INTRAMUSCULAR | Status: DC | PRN

## 2015-03-28 MED ORDER — LIDOCAINE HCL (CARDIAC) 20 MG/ML IV SOLN: INTRAVENOUS | Status: DC | PRN

## 2015-03-28 MED ORDER — DIPHENHYDRAMINE HCL 50 MG/ML IJ SOLN
INTRAMUSCULAR | Status: DC | PRN
  Administered 2015-03-28: 25 mg via INTRAVENOUS

## 2015-03-28 MED ORDER — METHYLPREDNISOLONE ACETATE 80 MG/ML IJ SUSP
INTRAMUSCULAR | Status: DC | PRN
  Administered 2015-03-28: 80 mg

## 2015-03-28 MED ORDER — PROMETHAZINE HCL 25 MG/ML IJ SOLN
6.2500 mg | INTRAMUSCULAR | Status: DC | PRN
  Administered 2015-03-28: 6.25 mg via INTRAVENOUS

## 2015-03-28 MED ORDER — FENTANYL CITRATE 0.05 MG/ML IJ SOLN
INTRAMUSCULAR | Status: DC | PRN
  Administered 2015-03-28 (×2): 50 ug via INTRAVENOUS
  Administered 2015-03-28: 100 ug via INTRAVENOUS
  Administered 2015-03-28: 50 ug via INTRAVENOUS

## 2015-03-28 MED ORDER — LACTATED RINGERS IV SOLN
INTRAVENOUS | Status: DC
  Administered 2015-03-28: 13:00:00 via INTRAVENOUS

## 2015-03-28 MED ORDER — ONDANSETRON HCL 4 MG/2ML IJ SOLN
INTRAMUSCULAR | Status: DC | PRN
  Administered 2015-03-28: 4 mg via INTRAVENOUS

## 2015-03-28 MED ORDER — CEFAZOLIN SODIUM-DEXTROSE 2-3 GM-% IV SOLR
INTRAVENOUS | Status: DC | PRN
  Administered 2015-03-28: 2 g via INTRAVENOUS

## 2015-03-28 MED ORDER — EPHEDRINE SULFATE 50 MG/ML IJ SOLN
INTRAMUSCULAR | Status: DC | PRN
  Administered 2015-03-28 (×2): 10 mg via INTRAVENOUS
  Administered 2015-03-28: 15 mg via INTRAVENOUS
  Administered 2015-03-28: 10 mg via INTRAVENOUS

## 2015-03-28 MED ORDER — PROPOFOL 10 MG/ML IV BOLUS
INTRAVENOUS | Status: DC | PRN
  Administered 2015-03-28: 150 mg via INTRAVENOUS

## 2015-03-28 MED ORDER — PROMETHAZINE HCL 25 MG/ML IJ SOLN
INTRAMUSCULAR | Status: AC
  Administered 2015-03-28: 6.25 mg via INTRAVENOUS
  Filled 2015-03-28: qty 1

## 2015-03-28 MED ORDER — OXYCODONE HCL 5 MG PO TABS: 5.0000 mg | ORAL_TABLET | Freq: Once | ORAL | Status: DC | PRN

## 2015-03-28 MED ORDER — ROCURONIUM BROMIDE 100 MG/10ML IV SOLN
INTRAVENOUS | Status: DC | PRN
  Administered 2015-03-28: 50 mg via INTRAVENOUS

## 2015-03-28 MED ORDER — FENTANYL CITRATE 0.05 MG/ML IJ SOLN
INTRAMUSCULAR | Status: AC
  Filled 2015-03-28: qty 5

## 2015-03-28 MED ORDER — 0.9 % SODIUM CHLORIDE (POUR BTL) OPTIME
TOPICAL | Status: DC | PRN
  Administered 2015-03-28: 1000 mL

## 2015-03-28 MED ORDER — METOCLOPRAMIDE HCL 5 MG PO TABS: 5.0000 mg | ORAL_TABLET | Freq: Three times a day (TID) | ORAL | Status: DC | PRN

## 2015-03-28 SURGICAL SUPPLY — 135 items
BANDAGE ELASTIC 3 VELCRO ST LF (GAUZE/BANDAGES/DRESSINGS) ×2 IMPLANT
BANDAGE ELASTIC 4 VELCRO ST LF (GAUZE/BANDAGES/DRESSINGS) ×7 IMPLANT
BANDAGE ELASTIC 6 VELCRO ST LF (GAUZE/BANDAGES/DRESSINGS) ×5 IMPLANT
BANDAGE ESMARK 6X9 LF (GAUZE/BANDAGES/DRESSINGS) ×3 IMPLANT
BIT DRILL 1.1 (BIT) ×4
BIT DRILL 1.1MM (BIT) ×1
BIT DRILL 100X2.5XANTM LCK (BIT) IMPLANT
BIT DRILL 60X20X1.1XQC TMX (BIT) IMPLANT
BIT DRL 100X2.5XANTM LCK (BIT) ×3
BIT DRL 60X20X1.1XQC TMX (BIT) ×3
BLADE SURG 10 STRL SS (BLADE) ×5 IMPLANT
BLADE SURG 15 STRL LF DISP TIS (BLADE) ×3 IMPLANT
BLADE SURG 15 STRL SS (BLADE) ×5
BLADE SURG ROTATE 9660 (MISCELLANEOUS) IMPLANT
BNDG CMPR 9X6 STRL LF SNTH (GAUZE/BANDAGES/DRESSINGS) ×3
BNDG CMPR MED 10X6 ELC LF (GAUZE/BANDAGES/DRESSINGS) ×3
BNDG COHESIVE 4X5 TAN STRL (GAUZE/BANDAGES/DRESSINGS) ×5 IMPLANT
BNDG ELASTIC 6X10 VLCR STRL LF (GAUZE/BANDAGES/DRESSINGS) ×2 IMPLANT
BNDG ESMARK 6X9 LF (GAUZE/BANDAGES/DRESSINGS) ×5
BNDG GAUZE ELAST 4 BULKY (GAUZE/BANDAGES/DRESSINGS) ×7 IMPLANT
BONE CANC CHIPS 40CC CAN1/2 (Bone Implant) ×5 IMPLANT
BRUSH SCRUB DISP (MISCELLANEOUS) ×14 IMPLANT
CANISTER SUCT 3000ML PPV (MISCELLANEOUS) ×5 IMPLANT
CHIPS CANC BONE 40CC CAN1/2 (Bone Implant) ×3 IMPLANT
COVER MAYO STAND STRL (DRAPES) ×5 IMPLANT
COVER SURGICAL LIGHT HANDLE (MISCELLANEOUS) ×5 IMPLANT
CUFF TOURNIQUET SINGLE 34IN LL (TOURNIQUET CUFF) ×2 IMPLANT
DRAPE C-ARM 42X72 X-RAY (DRAPES) ×5 IMPLANT
DRAPE C-ARMOR (DRAPES) ×5 IMPLANT
DRAPE INCISE IOBAN 66X45 STRL (DRAPES) ×5 IMPLANT
DRAPE ORTHO SPLIT 77X108 STRL (DRAPES) ×5
DRAPE SURG 17X23 STRL (DRAPES) ×2 IMPLANT
DRAPE SURG ORHT 6 SPLT 77X108 (DRAPES) IMPLANT
DRAPE U-SHAPE 47X51 STRL (DRAPES) ×5 IMPLANT
DRILL BIT 2.5MM (BIT) ×5
DRILL BIT 2.7X100 214235006 DU (MISCELLANEOUS) ×2 IMPLANT
DRIVER BIT 1.5 (TRAUMA) ×4 IMPLANT
DRSG ADAPTIC 3X8 NADH LF (GAUZE/BANDAGES/DRESSINGS) ×7 IMPLANT
DRSG MEPILEX BORDER 4X4 (GAUZE/BANDAGES/DRESSINGS) ×2 IMPLANT
DRSG PAD ABDOMINAL 8X10 ST (GAUZE/BANDAGES/DRESSINGS) ×20 IMPLANT
ELECT REM PT RETURN 9FT ADLT (ELECTROSURGICAL) ×5
ELECTRODE REM PT RTRN 9FT ADLT (ELECTROSURGICAL) ×3 IMPLANT
EVACUATOR 1/8 PVC DRAIN (DRAIN) IMPLANT
EVACUATOR 3/16  PVC DRAIN (DRAIN)
EVACUATOR 3/16 PVC DRAIN (DRAIN) IMPLANT
GAUZE SPONGE 4X4 12PLY STRL (GAUZE/BANDAGES/DRESSINGS) ×9 IMPLANT
GAUZE XEROFORM 1X8 LF (GAUZE/BANDAGES/DRESSINGS) ×2 IMPLANT
GLOVE BIO SURGEON STRL SZ7 (GLOVE) ×2 IMPLANT
GLOVE BIO SURGEON STRL SZ7.5 (GLOVE) ×7 IMPLANT
GLOVE BIO SURGEON STRL SZ8 (GLOVE) ×5 IMPLANT
GLOVE BIOGEL PI IND STRL 6.5 (GLOVE) IMPLANT
GLOVE BIOGEL PI IND STRL 7.0 (GLOVE) IMPLANT
GLOVE BIOGEL PI IND STRL 7.5 (GLOVE) ×3 IMPLANT
GLOVE BIOGEL PI IND STRL 8 (GLOVE) ×3 IMPLANT
GLOVE BIOGEL PI INDICATOR 6.5 (GLOVE) ×6
GLOVE BIOGEL PI INDICATOR 7.0 (GLOVE) ×2
GLOVE BIOGEL PI INDICATOR 7.5 (GLOVE) ×2
GLOVE BIOGEL PI INDICATOR 8 (GLOVE) ×2
GLOVE SS BIOGEL STRL SZ 6.5 (GLOVE) IMPLANT
GLOVE SUPERSENSE BIOGEL SZ 6.5 (GLOVE) ×2
GLOVE SURG SS PI 6.5 STRL IVOR (GLOVE) ×2 IMPLANT
GOWN STRL REUS W/ TWL LRG LVL3 (GOWN DISPOSABLE) ×6 IMPLANT
GOWN STRL REUS W/ TWL XL LVL3 (GOWN DISPOSABLE) ×3 IMPLANT
GOWN STRL REUS W/TWL LRG LVL3 (GOWN DISPOSABLE) ×10
GOWN STRL REUS W/TWL XL LVL3 (GOWN DISPOSABLE) ×10
GRAFT BNE CHIP CANC 1-8 40 (Bone Implant) IMPLANT
IMMOBILIZER KNEE 22 UNIV (SOFTGOODS) ×5 IMPLANT
KIT BASIN OR (CUSTOM PROCEDURE TRAY) ×5 IMPLANT
KIT ROOM TURNOVER OR (KITS) ×5 IMPLANT
LOCK SCREW 1.5X15MM (Screw) ×5 IMPLANT
NDL SUT 6 .5 CRC .975X.05 MAYO (NEEDLE) IMPLANT
NEEDLE 22X1 1/2 (OR ONLY) (NEEDLE) ×2 IMPLANT
NEEDLE MAYO TAPER (NEEDLE) ×10
NS IRRIG 1000ML POUR BTL (IV SOLUTION) ×5 IMPLANT
PACK ORTHO EXTREMITY (CUSTOM PROCEDURE TRAY) ×7 IMPLANT
PAD ABD 8X10 STRL (GAUZE/BANDAGES/DRESSINGS) ×4 IMPLANT
PAD ARMBOARD 7.5X6 YLW CONV (MISCELLANEOUS) ×10 IMPLANT
PAD CAST 3X4 CTTN HI CHSV (CAST SUPPLIES) IMPLANT
PAD CAST 4YDX4 CTTN HI CHSV (CAST SUPPLIES) ×3 IMPLANT
PADDING CAST COTTON 3X4 STRL (CAST SUPPLIES) ×5
PADDING CAST COTTON 4X4 STRL (CAST SUPPLIES) ×10
PADDING CAST COTTON 6X4 STRL (CAST SUPPLIES) ×5 IMPLANT
PLATE BONE CONT 1.5MM LOCKING (Plate) ×2 IMPLANT
PLATE LOCK 5H STD RT PROX TIB (Plate) ×2 IMPLANT
PLATE LOCK LG 5H LT PROX TIB (Plate) ×2 IMPLANT
SCREW 1.5X14 LOCKING 131220414 (Screw) ×2 IMPLANT
SCREW 1.5X15MM (Screw) ×2 IMPLANT
SCREW 1.5X18MM (Screw) ×5 IMPLANT
SCREW BN 18X1.5XST NONLOCK (Screw) IMPLANT
SCREW CORT 3.5X40 815037040 (Screw) ×2 IMPLANT
SCREW CORTICAL 3.5MM  34MM (Screw) ×2 IMPLANT
SCREW CORTICAL 3.5MM  42MM (Screw) ×2 IMPLANT
SCREW CORTICAL 3.5MM 34MM (Screw) IMPLANT
SCREW CORTICAL 3.5MM 38MM (Screw) ×2 IMPLANT
SCREW CORTICAL 3.5MM 42MM (Screw) IMPLANT
SCREW LOCK 1.5X15MM (Screw) IMPLANT
SCREW LOCK 3.5X36 DIST TIB (Screw) ×2 IMPLANT
SCREW LOCK 3.5X65 816135065 (Screw) ×4 IMPLANT
SCREW LOCK 3.5X65 DIST TIB (Screw) ×2 IMPLANT
SCREW LOCK 3.5X70 816135070 (Screw) ×2 IMPLANT
SCREW LOCK 3.5X75 816135075 DU (Screw) ×2 IMPLANT
SCREW LOCK CORT STAR 3.5X60 (Screw) ×2 IMPLANT
SCREW LOCKING 1.5X11MM (Screw) ×2 IMPLANT
SCREW LOW PROF CORTICAL 3.5X80 (Screw) ×2 IMPLANT
SCREW NL 1.5X11 WRIST (Screw) ×6 IMPLANT
SCREW NON LOCK 1.5X12 13122051 (Screw) ×4 IMPLANT
SCREW NON LOCK 1.5X13 13122051 (Screw) ×2 IMPLANT
SCREW NONIOC 1.5 16M (Screw) ×4 IMPLANT
SPONGE GAUZE 4X4 12PLY STER LF (GAUZE/BANDAGES/DRESSINGS) ×4 IMPLANT
SPONGE LAP 18X18 X RAY DECT (DISPOSABLE) ×7 IMPLANT
STAPLER VISISTAT 35W (STAPLE) ×5 IMPLANT
STOCKINETTE IMPERVIOUS LG (DRAPES) ×5 IMPLANT
SUCTION FRAZIER TIP 10 FR DISP (SUCTIONS) ×5 IMPLANT
SUT ETHILON 3 0 PS 1 (SUTURE) ×2 IMPLANT
SUT PROLENE 0 CT 2 (SUTURE) ×10 IMPLANT
SUT PROLENE 4 0 P 3 18 (SUTURE) ×4 IMPLANT
SUT VIC AB 0 CT1 27 (SUTURE) ×15
SUT VIC AB 0 CT1 27XBRD ANBCTR (SUTURE) ×3 IMPLANT
SUT VIC AB 1 CT1 27 (SUTURE) ×5
SUT VIC AB 1 CT1 27XBRD ANBCTR (SUTURE) ×3 IMPLANT
SUT VIC AB 2-0 CT1 27 (SUTURE) ×10
SUT VIC AB 2-0 CT1 TAPERPNT 27 (SUTURE) ×6 IMPLANT
SUT VIC AB 4-0 P-3 18X BRD (SUTURE) IMPLANT
SUT VIC AB 4-0 P3 18 (SUTURE) ×10
SYR 20ML ECCENTRIC (SYRINGE) IMPLANT
SYR 3ML LL SCALE MARK (SYRINGE) ×2 IMPLANT
SYR 5ML LUER SLIP (SYRINGE) ×2 IMPLANT
TOWEL OR 17X24 6PK STRL BLUE (TOWEL DISPOSABLE) ×9 IMPLANT
TOWEL OR 17X26 10 PK STRL BLUE (TOWEL DISPOSABLE) ×10 IMPLANT
TRAY FOLEY CATH 16FRSI W/METER (SET/KITS/TRAYS/PACK) ×2 IMPLANT
TUBE CONNECTING 12'X1/4 (SUCTIONS) ×1
TUBE CONNECTING 12X1/4 (SUCTIONS) ×4 IMPLANT
WATER STERILE IRR 1000ML POUR (IV SOLUTION) ×10 IMPLANT
WIRE K 1.6MM 144256 (MISCELLANEOUS) ×8 IMPLANT
YANKAUER SUCT BULB TIP NO VENT (SUCTIONS) ×5 IMPLANT

## 2015-03-28 NOTE — Op Note (Signed)
See dictation#[id number] Amedeo Plenty MD

## 2015-03-28 NOTE — Progress Notes (Signed)
OT Cancellation    03/28/15 1400  OT Visit Information  Last OT Received On 03/28/15  Reason Eval/Treat Not Completed Patient at procedure or test/ unavailable (surgery. will assess tomorrow if appropriate)  Maurie Boettcher, OTR/L  [phone] 03/28/2015

## 2015-03-28 NOTE — Brief Op Note (Signed)
03/24/2015 - 03/28/2015  7:10 PM  PATIENT:  Loretta Barnett  74 y.o. female  PRE-OPERATIVE DIAGNOSIS:   1. Right lateral tibial plateau fracture 2. Left Ring finger fracture  POST-OPERATIVE DIAGNOSIS:  1. Right lateral tibial plateau fracture 2. Left Ring finger fracture  PROCEDURE:  Procedure(s): 1. OPEN REDUCTION INTERNAL FIXATION (ORIF) RIGHT TIBIAL PLATEAU (Right)  2. ANTERIOR COMPARTMENT FASCIOTOMY  3. STRESS FLOUROSCOPY OF THE RIGHT KNEE 4. OPEN REDUCTION INTERNAL FIXATION (ORIF) LEFT RING FINGER (Left) METACARPAL  SURGEON:  Surgeon(s) and Role: Panel 1:    * Altamese Roebling, MD - Primary  Panel 2:    * Roseanne Kaufman, MD - Primary  PHYSICIAN ASSISTANT: Ainsley Spinner, PA-C  ANESTHESIA:   general  I/O:     SPECIMEN:  No Specimen  TOURNIQUET:  None  DICTATION: .Other Dictation: Dictation Number [id number]

## 2015-03-28 NOTE — Clinical Social Work Note (Signed)
CSW attempted to assess patient and complete SBIRT. Patient not in room. CSW will follow up at a later time.   Liz Beach MSW, [place], [place], [phone]
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
## 2015-03-28 NOTE — Anesthesia Postprocedure Evaluation (Signed)
Anesthesia Post Note  Patient: Loretta Barnett  Procedure(s) Performed: Procedure(s) (LRB): OPEN REDUCTION INTERNAL FIXATION (ORIF) RIGHT TIBIAL PLATEAU (Right) OPEN REDUCTION INTERNAL FIXATION METACARPAL FRACTURE LEFT RING FINGER (Left) KNEE INJECTION (Left)  Anesthesia type: General  Patient location: PACU  Post pain: Pain level controlled  Post assessment: Post-op Vital signs reviewed  Last Vitals: BP 167/71 mmHg  Pulse 93  Temp(Src) 36.2 C (Oral)  Resp 16  Ht 5' 4.5" (1.638 m)  Wt 194 lb 14.2 oz (88.4 kg)  BMI 32.95 kg/m2  SpO2 98%  Post vital signs: Reviewed  Level of consciousness: sedated  Complications: No apparent anesthesia complications

## 2015-03-28 NOTE — Anesthesia Preprocedure Evaluation (Addendum)
Anesthesia Evaluation  Patient identified by MRN, date of birth, ID band Patient awake    Reviewed: Allergy & Precautions, NPO status , Patient's Chart, lab work & pertinent test results  Airway Mallampati: III  TM Distance: >3 FB Neck ROM: Full    Dental  (+) Teeth Intact, Dental Advisory Given   Pulmonary neg pulmonary ROS,  breath sounds clear to auscultation        Cardiovascular hypertension, Pt. on medications Rhythm:Regular Rate:Normal     Neuro/Psych negative neurological ROS     GI/Hepatic Neg liver ROS, GERD-  ,  Endo/Other  diabetesMorbid obesity  Renal/GU negative Renal ROS     Musculoskeletal  (+) Arthritis -,   Abdominal   Peds  Hematology  (+) anemia , Hgb 8.5   Anesthesia Other Findings   Reproductive/Obstetrics                            Anesthesia Physical Anesthesia Plan  ASA: III  Anesthesia Plan: General   Post-op Pain Management:    Induction: Intravenous  Airway Management Planned: Oral ETT  Additional Equipment:   Intra-op Plan:   Post-operative Plan: Extubation in OR  Informed Consent: I have reviewed the patients History and Physical, chart, labs and discussed the procedure including the risks, benefits and alternatives for the proposed anesthesia with the patient or authorized representative who has indicated his/her understanding and acceptance.   Dental advisory given  Plan Discussed with: CRNA  Anesthesia Plan Comments:         Anesthesia Quick Evaluation

## 2015-03-28 NOTE — Transfer of Care (Signed)
Immediate Anesthesia Transfer of Care Note  Patient: Loretta Barnett  Procedure(s) Performed: Procedure(s): OPEN REDUCTION INTERNAL FIXATION (ORIF) RIGHT TIBIAL PLATEAU (Right) OPEN REDUCTION INTERNAL FIXATION METACARPAL FRACTURE LEFT RING FINGER (Left) KNEE INJECTION (Left)  Patient Location: PACU  Anesthesia Type:General  Level of Consciousness: awake  Airway & Oxygen Therapy: Patient Spontanous Breathing and Patient connected to nasal cannula oxygen  Post-op Assessment: Report given to RN and Post -op Vital signs reviewed and stable  Post vital signs: Reviewed and stable  Last Vitals:  Filed Vitals:   03/28/15 2047  BP: 157/73  Pulse: 100  Temp: 36.4 C  Resp: 24    Complications: No apparent anesthesia complications

## 2015-03-28 NOTE — Discharge Instructions (Addendum)
Keep your hand splint clean and dry.  Elevate your hand as much as possible.  Don't use the injured hand to lift, pull, push or carry more than 2 lbs.  [facility]th[facility] Discharge Instructions   General Discharge Instructions  WEIGHT BEARING STATUS: Nonweightbearing R leg and L hand  RANGE OF MOTION/ACTIVITY: range of motion R knee as tolerated   Wound care: daily dressing changes as needed. See instructions below   Diet: as you were eating previously.  Can use over the counter stool softeners and bowel preparations, such as Miralax, to help with bowel movements.  Narcotics can be constipating.  Be sure to drink plenty of fluids  STOP SMOKING OR USING NICOTINE PRODUCTS!!!!  As discussed nicotine severely impairs your body's ability to heal surgical and traumatic wounds but also impairs bone healing.  Wounds and bone heal by forming microscopic blood vessels (angiogenesis) and nicotine is a vasoconstrictor (essentially, shrinks blood vessels).  Therefore, if vasoconstriction occurs to these microscopic blood vessels they essentially disappear and are unable to deliver necessary nutrients to the healing tissue.  This is one modifiable factor that you can do to dramatically increase your chances of healing your injury.    (This means no smoking, no nicotine gum, patches, etc)  DO NOT USE NONSTEROIDAL ANTI-INFLAMMATORY DRUGS (NSAID'S)  Using products such as Advil (ibuprofen), Aleve (naproxen), Motrin (ibuprofen) for additional pain control during fracture healing can delay and/or prevent the healing response.  If you would like to take over the counter (OTC) medication, Tylenol (acetaminophen) is ok.  However, some narcotic medications that are given for pain control contain acetaminophen as well. Therefore, you should not exceed more than 4000 mg of tylenol in a day if you do not have liver disease.  Also note that there are may OTC medicines, such as cold medicines and allergy medicines  that my contain tylenol as well.  If you have any questions about medications and/or interactions please ask your doctor/PA or your pharmacist.   PAIN MEDICATION USE AND EXPECTATIONS  You have likely been given narcotic medications to help control your pain.  After a traumatic event that results in an fracture (broken bone) with or without surgery, it is ok to use narcotic pain medications to help control one's pain.  We understand that everyone responds to pain differently and each individual patient will be evaluated on a regular basis for the continued need for narcotic medications. Ideally, narcotic medication use should last no more than 6-8 weeks (coinciding with fracture healing).   As a patient it is your responsibility as well to monitor narcotic medication use and report the amount and frequency you use these medications when you come to your office visit.   We would also advise that if you are using narcotic medications, you should take a dose prior to therapy to maximize you participation.  IF YOU ARE ON NARCOTIC MEDICATIONS IT IS NOT PERMISSIBLE TO OPERATE A MOTOR VEHICLE (MOTORCYCLE/CAR/TRUCK/MOPED) OR HEAVY MACHINERY DO NOT MIX NARCOTICS WITH OTHER CNS (CENTRAL NERVOUS SYSTEM) DEPRESSANTS SUCH AS ALCOHOL       ICE AND ELEVATE INJURED/OPERATIVE EXTREMITY  Using ice and elevating the injured extremity above your heart can help with swelling and pain control.  Icing in a pulsatile fashion, such as 20 minutes on and 20 minutes off, can be followed.    Do not place ice directly on skin. Make sure there is a barrier between to skin and the ice pack.    Using frozen  items such as frozen peas works well as the conform nicely to the are that needs to be iced.  USE AN ACE WRAP OR TED HOSE FOR SWELLING CONTROL  In addition to icing and elevation, Ace wraps or TED hose are used to help limit and resolve swelling.  It is recommended to use Ace wraps or TED hose until you are informed to stop.      When using Ace Wraps start the wrapping distally (farthest away from the body) and wrap proximally (closer to the body)   Example: If you had surgery on your leg or thing and you do not have a splint on, start the ace wrap at the toes and work your way up to the thigh        If you had surgery on your upper extremity and do not have a splint on, start the ace wrap at your fingers and work your way up to the upper arm  IF YOU ARE IN A SPLINT OR CAST DO NOT [place]   If your splint gets wet for any reason please contact the office immediately. You may shower in your splint or cast as long as you keep it dry.  This can be done by wrapping in a cast cover or garbage back (or similar)  Do Not stick any thing down your splint or cast such as pencils, money, or hangers to try and scratch yourself with.  If you feel itchy take benadryl as prescribed on the bottle for itching  IF YOU ARE IN A CAM BOOT (BLACK BOOT)  You may remove boot periodically. Perform daily dressing changes as noted below.  Wash the liner of the boot regularly and wear a sock when wearing the boot. It is recommended that you sleep in the boot until told otherwise  CALL THE OFFICE WITH ANY QUESTIONS OR CONCERTS: [phone]     Discharge Pin Site Instructions  Dress pins daily with Kerlix roll starting on POD 2. Wrap the Kerlix so that it tamps the skin down around the pin-skin interface to prevent/limit motion of the skin relative to the pin.  (Pin-skin motion is the primary cause of pain and infection related to external fixator pin sites).  Remove any crust or coagulum that may obstruct drainage with a saline moistened gauze or soap and water.  After POD 3, if there is no discernable drainage on the pin site dressing, the interval for change can by increased to every other day.  You may shower with the fixator, cleaning all pin sites gently with soap and water.  If you have a surgical wound this needs to be  completely dry and without drainage before showering.  The extremity can be lifted by the fixator to facilitate wound care and transfers.  Notify the office/Doctor if you experience increasing drainage, redness, or pain from a pin site, or if you notice purulent (thick, snot-like) drainage.  Discharge Wound Care Instructions  Do NOT apply any ointments, solutions or lotions to pin sites or surgical wounds.  These prevent needed drainage and even though solutions like hydrogen peroxide kill bacteria, they also damage cells lining the pin sites that help fight infection.  Applying lotions or ointments can keep the wounds moist and can cause them to breakdown and open up as well. This can increase the risk for infection. When in doubt call the office.  Surgical incisions should be dressed daily.  If any drainage is noted, use one layer  of adaptic, then gauze, Kerlix, and an ace wrap.  Once the incision is completely dry and without drainage, it may be left open to air out.  Showering may begin 36-48 hours later.  Cleaning gently with soap and water.  Traumatic wounds should be dressed daily as well.    One layer of adaptic, gauze, Kerlix, then ace wrap.  The adaptic can be discontinued once the draining has ceased    If you have a wet to dry dressing: wet the gauze with saline the squeeze as much saline out so the gauze is moist (not soaking wet), place moistened gauze over wound, then place a dry gauze over the moist one, followed by Kerlix wrap, then ace wrap.

## 2015-03-28 NOTE — Anesthesia Procedure Notes (Signed)
Procedure Name: Intubation Date/Time: 03/28/2015 4:54 PM Performed by: Manus Gunning, Sava Proby J Pre-anesthesia Checklist: Patient identified, Timeout performed, Emergency Drugs available, Suction available and Patient being monitored Patient Re-evaluated:Patient Re-evaluated prior to inductionOxygen Delivery Method: Circle system utilized Preoxygenation: Pre-oxygenation with 100% oxygen Intubation Type: IV induction Ventilation: Mask ventilation without difficulty Laryngoscope Size: Mac and 3 Grade View: Grade I Tube type: Oral Tube size: 7.0 mm Number of attempts: 1 Placement Confirmation: ETT inserted through vocal cords under direct vision,  positive ETCO2 and breath sounds checked- equal and bilateral Secured at: 22 cm Tube secured with: Tape Dental Injury: Teeth and Oropharynx as per pre-operative assessment
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
## 2015-03-29 DIAGNOSIS — E559 Vitamin D deficiency, unspecified: Secondary | ICD-10-CM | POA: Diagnosis present

## 2015-03-29 DIAGNOSIS — M1711 Unilateral primary osteoarthritis, right knee: Secondary | ICD-10-CM | POA: Diagnosis present

## 2015-03-29 DIAGNOSIS — M1712 Unilateral primary osteoarthritis, left knee: Secondary | ICD-10-CM | POA: Diagnosis present

## 2015-03-29 LAB — BASIC METABOLIC PANEL
Anion gap: 10 (ref 5–15)
BUN: 13 mg/dL (ref 6–23)
CO2: 25 mmol/L (ref 19–32)
Calcium: 8.5 mg/dL (ref 8.4–10.5)
Chloride: 103 mmol/L (ref 96–112)
Creatinine, Ser: 1.2 mg/dL — ABNORMAL HIGH (ref 0.50–1.10)
GFR calc Af Amer: 51 mL/min — ABNORMAL LOW (ref >90)
GFR, EST NON AFRICAN AMERICAN: 44 mL/min — AB (ref >90)
Glucose, Bld: 244 mg/dL — ABNORMAL HIGH (ref 70–99)
POTASSIUM: 3.6 mmol/L (ref 3.5–5.1)
SODIUM: 138 mmol/L (ref 135–145)

## 2015-03-29 LAB — CBC
HCT: 24.4 % — ABNORMAL LOW (ref 36.0–46.0)
Hemoglobin: 7.8 g/dL — ABNORMAL LOW (ref 12.0–15.0)
MCH: 27.1 pg (ref 26.0–34.0)
MCHC: 32 g/dL (ref 30.0–36.0)
MCV: 84.7 fL (ref 78.0–100.0)
PLATELETS: 259 10*3/uL (ref 150–400)
RBC: 2.88 MIL/uL — AB (ref 3.87–5.11)
RDW: 13.2 % (ref 11.5–15.5)
WBC: 8.8 10*3/uL (ref 4.0–10.5)

## 2015-03-29 LAB — GLUCOSE, CAPILLARY
GLUCOSE-CAPILLARY: 203 mg/dL — AB (ref 70–99)
Glucose-Capillary: 166 mg/dL — ABNORMAL HIGH (ref 70–99)
Glucose-Capillary: 146 mg/dL — ABNORMAL HIGH (ref 70–99)
Glucose-Capillary: 173 mg/dL — ABNORMAL HIGH (ref 70–99)

## 2015-03-29 LAB — VITAMIN D 25 HYDROXY (VIT D DEFICIENCY, FRACTURES): Vit D, 25-Hydroxy: 21.5 ng/mL — ABNORMAL LOW (ref 30.0–100.0)

## 2015-03-29 MED ORDER — VITAMIN D (ERGOCALCIFEROL) 1.25 MG (50000 UNIT) PO CAPS
50000.0000 [IU] | ORAL_CAPSULE | ORAL | Status: DC
  Administered 2015-03-29: 50000 [IU] via ORAL
  Filled 2015-03-29: qty 1

## 2015-03-29 MED ORDER — VITAMIN D 1000 UNITS PO TABS
1000.0000 [IU] | ORAL_TABLET | Freq: Two times a day (BID) | ORAL | Status: DC
  Administered 2015-03-29 – 2015-03-31 (×5): 1000 [IU] via ORAL
  Filled 2015-03-29 (×5): qty 1

## 2015-03-29 MED ORDER — ADULT MULTIVITAMIN W/MINERALS CH
1.0000 | ORAL_TABLET | Freq: Every day | ORAL | Status: DC
  Administered 2015-03-29 – 2015-03-31 (×3): 1 via ORAL
  Filled 2015-03-29 (×3): qty 1

## 2015-03-29 NOTE — Clinical Social Work Psychosocial (Cosign Needed)
Clinical Social Work Department BRIEF PSYCHOSOCIAL ASSESSMENT 03/29/2015  Patient:  Loretta Barnett, Loretta Barnett     Account Number:  [id number]     [place] date:  03/24/2015  Clinical Social Worker:  Oretha Ellis, [place]  Date/Time:  03/29/2015 09:15 PM  Referred by:  Physician  Date Referred:  03/29/2015 Referred for  Psychosocial assessment   Other Referral:   Interview type:  Patient Other interview type:    PSYCHOSOCIAL DATA Living Status:  HUSBAND Admitted from facility:   Level of care:   Primary support name:  Marcie Mowers Primary support relationship to patient:  SPOUSE Degree of support available:   Support system is very strong.    CURRENT CONCERNS Current Concerns  Post-Acute Placement   Other Concerns:    SOCIAL WORK ASSESSMENT / PLAN CSW spoke with patient about plan to discharge. Patient stated she would like to discharge home with home health through [facility]. Patient lives with husband, who will provide 24/7 supervision and patient's adult children and church friend agreed they would provide support also. CSW remains available for support and to facilitate patient discharge need once medically ready.   Assessment/plan status:  Psychosocial Support/Ongoing Assessment of Needs Other assessment/ plan:   SBIRT completed. No intervention needed.   Information/referral to community resources:    PATIENT'S/FAMILY'S RESPONSE TO PLAN OF CARE: Patient was alert and oriented x3 reclining in bedside chair. Family and friend present at bedside. Family was very supportive of patient. Patient engaged in CSW assessment. Patient and family in agreement with discharge plan. Patient understanding of CSW role and appreciative of support.

## 2015-03-29 NOTE — Progress Notes (Signed)
Orthopedic Tech Progress Note Patient Details:  Loretta Barnett 09/10/1941 [id number]  Patient ID: Loretta Barnett, female   DOB: Apr 14, 1941, 74 y.o.   MRN: [id number] Pt unable to use trapeze bar patient helper  Hildred Priest 03/29/2015, 12:19 PM
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
## 2015-03-29 NOTE — Progress Notes (Signed)
OT Cancellation Note  Patient Details Name: Loretta Barnett MRN: [id number] DOB: 04-05-1941   Cancelled Treatment:    Reason Eval/Treat Not Completed: Other (comment) Eating dinner. Will see in am. [place], OTR/L  [phone] 03/29/2015 03/29/2015, 4:59 PM

## 2015-03-29 NOTE — Progress Notes (Signed)
CSW Intern, Alena Bills [facility]'s psychosocial reviewed and I agree with its content.

## 2015-03-29 NOTE — Progress Notes (Signed)
Orthopedic Tech Progress Note Patient Details:  Loretta Barnett [birth year] [id number] Brace order completed by [employer] vendor. Patient ID: Loretta Barnett, female   DOB: 11/21/1941, 74 y.o.   MRN: [id number]   [name] 03/29/2015, 2:56 PM

## 2015-03-29 NOTE — Clinical Social Work Psychosocial (Signed)
     Clinical Social Work Department BRIEF PSYCHOSOCIAL ASSESSMENT 03/29/2015  Patient:  Loretta Barnett, Loretta Barnett     Account Number:  [id number]     [place] date:  03/24/2015  Clinical Social Worker:  Oretha Ellis, [place]  Date/Time:  03/29/2015 09:15 PM  Referred by:  Physician  Date Referred:  03/29/2015 Referred for  Psychosocial assessment   Other Referral:   Interview type:  Family Other interview type:    PSYCHOSOCIAL DATA Living Status:  HUSBAND Admitted from facility:   Level of care:   Primary support name:  Delisa Finck Primary support relationship to patient:  SPOUSE Degree of support available:   Support sytem is very strong.    CURRENT CONCERNS Current Concerns  Post-Acute Placement   Other Concerns:    SOCIAL WORK ASSESSMENT / PLAN CSW spoke with patient about plan to discharge. Patient stated she would like to discharge home with home health through [facility] [facility]. Patient lives with husband, who will provide 24/7 supervision and patient's adult children and church friend agreed they would provide support also. CSW remains available for support and to facilitate patient discharge need once medically ready.   Assessment/plan status:  Psychosocial Support/Ongoing Assessment of Needs Other assessment/ plan:   SBIRT completed. No intervention needed.   Information/referral to community resources:    PATIENTS/FAMILYS RESPONSE TO PLAN OF CARE: Patient was alert and oriented x3 reclining in bedside chair. Family and friend present at bedside. Family was very supportive of patient. Patient engaged in CSW assessment. Patient and family in agreement with discharge plan. Patient understanding of CSW role and appreciative of support.

## 2015-03-29 NOTE — Progress Notes (Signed)
Orthopedic Tech Progress Note Patient Details:  Loretta Barnett 1941-10-11 [id number]  Patient ID: Loretta Barnett, female   DOB: [birth year], 74 y.o.   MRN: [id number] Called in advanced  Brace order; spoke with Loretta Barnett, Loretta Barnett 03/29/2015, 9:06 AM

## 2015-03-29 NOTE — Progress Notes (Signed)
SBIRT completed. No alcohol use reported. No further intervention needed.

## 2015-03-30 DIAGNOSIS — M1711 Unilateral primary osteoarthritis, right knee: Secondary | ICD-10-CM

## 2015-03-30 DIAGNOSIS — S62305D Unspecified fracture of fourth metacarpal bone, left hand, subsequent encounter for fracture with routine healing: Secondary | ICD-10-CM

## 2015-03-30 DIAGNOSIS — S2249XA Multiple fractures of ribs, unspecified side, initial encounter for closed fracture: Secondary | ICD-10-CM

## 2015-03-30 DIAGNOSIS — D62 Acute posthemorrhagic anemia: Secondary | ICD-10-CM

## 2015-03-30 DIAGNOSIS — S82141D Displaced bicondylar fracture of right tibia, subsequent encounter for closed fracture with routine healing: Secondary | ICD-10-CM

## 2015-03-30 HISTORY — PX: TIBIA FRACTURE SURGERY: SHX806

## 2015-03-30 LAB — CALCIUM, IONIZED: Calcium, Ion: 1.19 mmol/L (ref 1.12–1.32)

## 2015-03-30 LAB — CBC
HCT: 22.6 % — ABNORMAL LOW (ref 36.0–46.0)
Hemoglobin: 7.4 g/dL — ABNORMAL LOW (ref 12.0–15.0)
MCH: 27.2 pg (ref 26.0–34.0)
MCHC: 32.7 g/dL (ref 30.0–36.0)
MCV: 83.1 fL (ref 78.0–100.0)
PLATELETS: 255 10*3/uL (ref 150–400)
RBC: 2.72 MIL/uL — ABNORMAL LOW (ref 3.87–5.11)
RDW: 13.5 % (ref 11.5–15.5)
WBC: 8.1 10*3/uL (ref 4.0–10.5)

## 2015-03-30 LAB — COMPREHENSIVE METABOLIC PANEL
ALK PHOS: 57 U/L (ref 39–117)
ALT: 17 U/L (ref 0–35)
AST: 25 U/L (ref 0–37)
Albumin: 2.8 g/dL — ABNORMAL LOW (ref 3.5–5.2)
Anion gap: 11 (ref 5–15)
BILIRUBIN TOTAL: 0.6 mg/dL (ref 0.3–1.2)
BUN: 13 mg/dL (ref 6–23)
CHLORIDE: 95 mmol/L — AB (ref 96–112)
CO2: 27 mmol/L (ref 19–32)
Calcium: 8.6 mg/dL (ref 8.4–10.5)
Creatinine, Ser: 0.9 mg/dL (ref 0.50–1.10)
GFR, EST AFRICAN AMERICAN: 73 mL/min — AB (ref >90)
GFR, EST NON AFRICAN AMERICAN: 63 mL/min — AB (ref >90)
GLUCOSE: 172 mg/dL — AB (ref 70–99)
POTASSIUM: 3.5 mmol/L (ref 3.5–5.1)
SODIUM: 133 mmol/L — AB (ref 135–145)
Total Protein: 6 g/dL (ref 6.0–8.3)

## 2015-03-30 LAB — GLUCOSE, CAPILLARY
Glucose-Capillary: 160 mg/dL — ABNORMAL HIGH (ref 70–99)
Glucose-Capillary: 192 mg/dL — ABNORMAL HIGH (ref 70–99)
Glucose-Capillary: 200 mg/dL — ABNORMAL HIGH (ref 70–99)
Glucose-Capillary: 209 mg/dL — ABNORMAL HIGH (ref 70–99)

## 2015-03-30 LAB — MAGNESIUM: MAGNESIUM: 1.6 mg/dL (ref 1.5–2.5)

## 2015-03-30 LAB — PHOSPHORUS: Phosphorus: 3.8 mg/dL (ref 2.3–4.6)

## 2015-03-30 LAB — TSH: TSH: 3.891 u[IU]/mL (ref 0.350–4.500)

## 2015-03-30 NOTE — Progress Notes (Addendum)
I met with pt at bedside. She does not want to be admitted to inpt rehab. She lives in a split level and has a back entrance with level entry into the lower level. Lower level has a family room, full bath and bedroom. She requests a BSC, wheelchair , platform RW and home health PT. She has been bathing self for the last few days. She does not feel she needs OT. Family can provide 24/7 assist. I left my number in case she were to change her mind or if her family would have any questions. [phone] 
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
## 2015-03-30 NOTE — Clinical Social Work Note (Signed)
Clinical Social Worker will sign off for now as social work intervention is no longer needed. Please consult us again if new need arises.  Jesse Diera Wirkkala, LCSW [phone]  

## 2015-03-30 NOTE — Progress Notes (Signed)
Rehab Admissions Coordinator Note:  Patient was screened by Retta Diones for appropriateness for an Inpatient Acute Rehab Consult.  Noted OT recommending inpatient rehab consult.  At this time, we are recommending Inpatient Rehab consult.  Retta Diones 03/30/2015, 1:47 PM  I can be reached at [phone].
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
## 2015-03-31 ENCOUNTER — Encounter (HOSPITAL_COMMUNITY): Payer: Self-pay | Admitting: General Practice

## 2015-03-31 LAB — TRANSFERRIN: Transferrin: 207 mg/dL (ref 200–370)

## 2015-03-31 LAB — PREALBUMIN: Prealbumin: 16 mg/dL — ABNORMAL LOW (ref 17–34)

## 2015-03-31 LAB — GLUCOSE, CAPILLARY
GLUCOSE-CAPILLARY: 180 mg/dL — AB (ref 70–99)
GLUCOSE-CAPILLARY: 195 mg/dL — AB (ref 70–99)

## 2015-03-31 LAB — CBC
HEMATOCRIT: 23.2 % — AB (ref 36.0–46.0)
Hemoglobin: 7.6 g/dL — ABNORMAL LOW (ref 12.0–15.0)
MCH: 27.7 pg (ref 26.0–34.0)
MCHC: 32.8 g/dL (ref 30.0–36.0)
MCV: 84.7 fL (ref 78.0–100.0)
Platelets: 261 10*3/uL (ref 150–400)
RBC: 2.74 MIL/uL — ABNORMAL LOW (ref 3.87–5.11)
RDW: 13.5 % (ref 11.5–15.5)
WBC: 8 10*3/uL (ref 4.0–10.5)

## 2015-03-31 LAB — PTH, INTACT AND CALCIUM
CALCIUM TOTAL (PTH): 8.7 mg/dL (ref 8.7–10.3)
PTH: 35 pg/mL (ref 15–65)

## 2015-03-31 MED ORDER — ENOXAPARIN SODIUM 40 MG/0.4ML ~~LOC~~ SOLN: 40.0000 mg | SUBCUTANEOUS | Status: DC

## 2015-03-31 MED ORDER — CHOLECALCIFEROL 25 MCG (1000 UT) PO TABS: 1000.0000 [IU] | ORAL_TABLET | Freq: Two times a day (BID) | ORAL | Status: DC

## 2015-03-31 MED ORDER — ENOXAPARIN (LOVENOX) PATIENT EDUCATION KIT
PACK | Freq: Once | Status: DC
  Filled 2015-03-31: qty 1

## 2015-03-31 MED ORDER — VITAMIN D (ERGOCALCIFEROL) 1.25 MG (50000 UNIT) PO CAPS: 50000.0000 [IU] | ORAL_CAPSULE | ORAL | Status: DC
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
## 2015-04-02 LAB — VITAMIN D 1,25 DIHYDROXY
Vitamin D 1, 25 (OH)2 Total: 94 pg/mL
Vitamin D2 1, 25 (OH)2: <10 pg/mL
Vitamin D3 1, 25 (OH)2: 94 pg/mL

## 2015-04-03 ENCOUNTER — Encounter (HOSPITAL_COMMUNITY): Payer: Self-pay | Admitting: Orthopedic Surgery
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
## 2015-06-01 ENCOUNTER — Ambulatory Visit: Payer: Medicare Other | Attending: Orthopedic Surgery | Admitting: Physical Therapy

## 2015-06-01 DIAGNOSIS — M25661 Stiffness of right knee, not elsewhere classified: Secondary | ICD-10-CM | POA: Diagnosis not present

## 2015-06-01 DIAGNOSIS — M25561 Pain in right knee: Secondary | ICD-10-CM | POA: Insufficient documentation
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
## 2015-06-06 ENCOUNTER — Ambulatory Visit: Payer: Medicare Other | Admitting: Physical Therapy

## 2015-06-06 DIAGNOSIS — M25561 Pain in right knee: Secondary | ICD-10-CM

## 2015-06-06 DIAGNOSIS — M25661 Stiffness of right knee, not elsewhere classified: Secondary | ICD-10-CM
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
## 2015-06-08 ENCOUNTER — Ambulatory Visit: Payer: Medicare Other | Admitting: Physical Therapy

## 2015-06-08 DIAGNOSIS — M25661 Stiffness of right knee, not elsewhere classified: Secondary | ICD-10-CM

## 2015-06-08 DIAGNOSIS — M25561 Pain in right knee: Secondary | ICD-10-CM | POA: Diagnosis not present
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
## 2015-06-12 ENCOUNTER — Other Ambulatory Visit: Payer: Self-pay

## 2015-06-13 ENCOUNTER — Ambulatory Visit: Payer: Medicare Other | Admitting: Physical Therapy

## 2015-06-13 DIAGNOSIS — M25561 Pain in right knee: Secondary | ICD-10-CM

## 2015-06-13 DIAGNOSIS — M25661 Stiffness of right knee, not elsewhere classified: Secondary | ICD-10-CM
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
## 2015-06-15 ENCOUNTER — Ambulatory Visit: Payer: Medicare Other | Admitting: Physical Therapy

## 2015-06-15 ENCOUNTER — Encounter: Payer: Self-pay | Admitting: Physical Therapy

## 2015-06-15 DIAGNOSIS — M25561 Pain in right knee: Secondary | ICD-10-CM | POA: Diagnosis not present

## 2015-06-15 DIAGNOSIS — M25661 Stiffness of right knee, not elsewhere classified: Secondary | ICD-10-CM

## 2015-06-15 NOTE — Patient Instructions (Signed)
Knee High   Holding stable object, raise knee to hip level, then lower knee. Repeat with other knee. Complete __10-30_ repetitions. Do __2__ sessions per day.  ABDUCTION: Standing (Active)   Stand, feet flat. Lift right leg out to side. Use _0__ lbs. Complete __10-30_ repetitions. Perform __2_ sessions per day.      Hip abduction   While sitting with good posture, tie theraband around knees and pull apart. Slowly resume starting position. x30 1-2 x day
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
## 2015-06-20 ENCOUNTER — Encounter: Payer: Self-pay | Admitting: Internal Medicine

## 2015-06-20 ENCOUNTER — Encounter: Payer: Self-pay | Admitting: *Deleted

## 2015-06-20 ENCOUNTER — Ambulatory Visit: Payer: Medicare Other | Attending: Orthopedic Surgery | Admitting: *Deleted

## 2015-06-20 DIAGNOSIS — M25561 Pain in right knee: Secondary | ICD-10-CM | POA: Diagnosis not present

## 2015-06-20 DIAGNOSIS — M25661 Stiffness of right knee, not elsewhere classified: Secondary | ICD-10-CM
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
## 2015-06-27 ENCOUNTER — Encounter: Payer: Medicare Other | Admitting: *Deleted

## 2015-06-29 ENCOUNTER — Ambulatory Visit: Payer: Medicare Other | Admitting: *Deleted

## 2015-06-29 ENCOUNTER — Encounter: Payer: Self-pay | Admitting: *Deleted

## 2015-06-29 DIAGNOSIS — M25561 Pain in right knee: Secondary | ICD-10-CM

## 2015-06-29 DIAGNOSIS — M25661 Stiffness of right knee, not elsewhere classified: Secondary | ICD-10-CM
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
## 2015-07-04 ENCOUNTER — Encounter: Payer: Self-pay | Admitting: Physical Therapy

## 2015-07-04 ENCOUNTER — Ambulatory Visit: Payer: Medicare Other | Admitting: Physical Therapy

## 2015-07-04 DIAGNOSIS — M25661 Stiffness of right knee, not elsewhere classified: Secondary | ICD-10-CM

## 2015-07-04 DIAGNOSIS — M25561 Pain in right knee: Secondary | ICD-10-CM | POA: Diagnosis not present
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
## 2015-07-06 ENCOUNTER — Encounter: Payer: Self-pay | Admitting: Physical Therapy

## 2015-07-06 ENCOUNTER — Ambulatory Visit: Payer: Medicare Other | Admitting: Physical Therapy

## 2015-07-06 ENCOUNTER — Other Ambulatory Visit: Payer: Self-pay | Admitting: Orthopedic Surgery

## 2015-07-06 DIAGNOSIS — S82121P Displaced fracture of lateral condyle of right tibia, subsequent encounter for closed fracture with malunion: Secondary | ICD-10-CM

## 2015-07-06 DIAGNOSIS — M25561 Pain in right knee: Secondary | ICD-10-CM | POA: Diagnosis not present

## 2015-07-06 DIAGNOSIS — M25661 Stiffness of right knee, not elsewhere classified: Secondary | ICD-10-CM
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
## 2015-07-10 ENCOUNTER — Ambulatory Visit
Admission: RE | Admit: 2015-07-10 | Discharge: 2015-07-10 | Disposition: A | Discharge status: Home / Self Care | Payer: Medicare Other | Source: Ambulatory Visit | Attending: Orthopedic Surgery | Admitting: Orthopedic Surgery

## 2015-07-10 DIAGNOSIS — S82121P Displaced fracture of lateral condyle of right tibia, subsequent encounter for closed fracture with malunion: Secondary | ICD-10-CM

## 2015-07-11 ENCOUNTER — Encounter: Payer: Self-pay | Admitting: Physical Therapy

## 2015-07-11 ENCOUNTER — Ambulatory Visit: Payer: Medicare Other | Admitting: Physical Therapy

## 2015-07-11 DIAGNOSIS — M25661 Stiffness of right knee, not elsewhere classified: Secondary | ICD-10-CM

## 2015-07-11 DIAGNOSIS — M25561 Pain in right knee: Secondary | ICD-10-CM
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
## 2015-07-13 ENCOUNTER — Ambulatory Visit: Payer: Medicare Other | Admitting: Physical Therapy

## 2015-07-13 ENCOUNTER — Encounter: Payer: Self-pay | Admitting: Physical Therapy

## 2015-07-13 DIAGNOSIS — M25561 Pain in right knee: Secondary | ICD-10-CM

## 2015-07-13 DIAGNOSIS — M25661 Stiffness of right knee, not elsewhere classified: Secondary | ICD-10-CM
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
## 2015-07-18 ENCOUNTER — Ambulatory Visit: Payer: Medicare Other | Attending: Orthopedic Surgery | Admitting: *Deleted

## 2015-07-18 ENCOUNTER — Encounter: Payer: Self-pay | Admitting: *Deleted

## 2015-07-18 DIAGNOSIS — M25661 Stiffness of right knee, not elsewhere classified: Secondary | ICD-10-CM

## 2015-07-18 DIAGNOSIS — M25561 Pain in right knee: Secondary | ICD-10-CM | POA: Diagnosis not present
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
## 2015-07-20 ENCOUNTER — Ambulatory Visit: Payer: Medicare Other | Admitting: Physical Therapy

## 2015-07-25 ENCOUNTER — Encounter: Payer: Self-pay | Admitting: Physical Therapy

## 2015-07-25 ENCOUNTER — Ambulatory Visit: Payer: Medicare Other | Admitting: Physical Therapy

## 2015-07-25 DIAGNOSIS — M25661 Stiffness of right knee, not elsewhere classified: Secondary | ICD-10-CM

## 2015-07-25 DIAGNOSIS — M25561 Pain in right knee: Secondary | ICD-10-CM
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
## 2015-07-27 ENCOUNTER — Ambulatory Visit: Payer: Medicare Other | Admitting: Physical Therapy

## 2015-07-27 ENCOUNTER — Encounter: Payer: Self-pay | Admitting: Physical Therapy

## 2015-07-27 DIAGNOSIS — M25561 Pain in right knee: Secondary | ICD-10-CM | POA: Diagnosis not present

## 2015-07-27 DIAGNOSIS — M25661 Stiffness of right knee, not elsewhere classified: Secondary | ICD-10-CM

## 2015-07-27 NOTE — Therapy (Signed)
[facility] [place], [place], [address] Phone: [phone]   Fax:  [phone]  Physical Therapy Treatment  Patient Details  Name: Loretta Barnett MRN: [id number] Date of Birth: 1941/07/14 Referring Provider:  Lucia Gaskins, MD  Encounter Date: 07/27/2015      PT End of Session - 07/27/15 1054    Visit Number 14   Number of Visits 18   Date for PT Re-Evaluation 07/27/15   PT Start Time 9784   PT Stop Time 1123   PT Time Calculation (min) 47 min   Activity Tolerance Patient tolerated treatment well   Behavior During Therapy [facility] for tasks assessed/performed      Past Medical History  Diagnosis Date  . Arthritis   . Seasonal allergies   . Diabetes mellitus   . Hypertension   . GERD (gastroesophageal reflux disease)   . Obesity   . Colon polyp   . Complication of anesthesia   . PONV (postoperative nausea and vomiting)     Past Surgical History  Procedure Laterality Date  . Leg surgery  1998    left leg and ankle fracture  . Tonsillectomy and adenoidectomy  1948  . Tubal ligation  1972  . Knee arthroscopy Right   . Eye surgery Bilateral 2008    for macular holes; had lens implants  . Tibia fracture surgery Right 03/30/2015  . Orif finger fracture Left   . Orif tibia plateau Right 03/28/2015    Procedure: OPEN REDUCTION INTERNAL FIXATION (ORIF) RIGHT TIBIAL PLATEAU;  Surgeon: Altamese Town Creek, MD;  Location: [place];  Service: Orthopedics;  Laterality: Right;  . Injection knee Left 03/28/2015    Procedure: KNEE INJECTION;  Surgeon: Altamese Parrish, MD;  Location: [place];  Service: Orthopedics;  Laterality: Left;  . Open reduction internal fixation (orif) hand Left 03/28/2015    Procedure: OPEN REDUCTION INTERNAL FIXATION METACARPAL FRACTURE LEFT RING FINGER;  Surgeon: Roseanne Kaufman, MD;  Location: [place];  Service: Orthopedics;  Laterality: Left;    There were no vitals filed for this visit.  Visit Diagnosis:   Right knee pain  Knee stiffness, right      Subjective Assessment - 07/27/15 1043    Subjective Reports that MD said screws are not where he wanted them to be and wants to remove pins and allow bones to heal for 8 weeks and then do a TKR. Today is patient last day per request, she will have pins out next week.   Limitations Walking   Patient Stated Goals Get back to my previous level of function.   Currently in Pain? No/denies                         [facility] PT Treatment/Exercise - 07/27/15 0001    Knee/Hip Exercises: Aerobic   Nustep 2mn L5   Knee/Hip Exercises: Standing   Lateral Step Up --   Forward Step Up --   Rocker Board 2 minutes   Electrical Stimulation   Electrical Stimulation Location R knee   Electrical Stimulation Action IFC   Electrical Stimulation Parameters 1-_0    Electrical Stimulation Goals Edema;Pain   Vasopneumatic   Number Minutes Vasopneumatic  15 minutes   Vasopnuematic Location  Knee   Vasopneumatic Pressure Medium                PT Education - 07/27/15 1053    Education Details HEP strength   Person(s) Educated Patient  Methods Explanation;Demonstration;Handout   Comprehension Verbalized understanding;Returned demonstration          PT Short Term Goals - 06/29/15 1049    PT SHORT TERM GOAL #1   Title Ind with an initial HEP.   Period Weeks   Status Achieved           PT Long Term Goals - 2015/08/26 1106    PT LONG TERM GOAL #1   Title Ind with an advanced HEP.   Time 6   Period Weeks   Status Achieved   PT LONG TERM GOAL #2   Title Full active right knee extension.   Time 6   Period Weeks   Status Achieved   PT LONG TERM GOAL #3   Title Active right knee flexion= 120 degrees.   Time 6   Period Weeks   Status Achieved   PT LONG TERM GOAL #4   Title 5/5 right LE strength to increase stability for functional tasks   Time 6   Period Weeks   Status Not Met  HS -4/5 Quad 4+/5   PT LONG TERM  GOAL #5   Title Perform ADL's with pain not > 3/10.   Time 6   Period Weeks   Status Achieved   PT LONG TERM GOAL #6   Title Walk a community distance with a straight cane.   Time 6   Period Weeks   Status Achieved               Plan - 2015/08/26 1109    Clinical Impression Statement Patient continues to progress and and met all goals except strengthening, yet difficulty today with feeling like knee will give way. Educated patient on HEP and self care for knee to avoind pain during healing process.Patient would like today to be the last day because she will have screws taken out next week followed by 8 weeks of healing before a TKR.    Pt will benefit from skilled therapeutic intervention in order to improve on the following deficits Pain;Decreased activity tolerance;Decreased range of motion;Decreased strength;Decreased mobility   Rehab Potential Good   PT Frequency 3x / week   PT Duration 6 weeks   PT Treatment/Interventions ADLs/Self Care Home Management;Cryotherapy;Advice worker;Therapeutic activities;Therapeutic exercise;Neuromuscular re-education;Manual techniques;Vasopneumatic Device   PT Next Visit Plan DC per patient   Consulted and Agree with Plan of Care Patient          G-Codes - August 26, 2015 1350    Functional Assessment Tool Used Clinical judgement     Functional Limitation Mobility: Walking and moving around   Mobility: Walking and Moving Around Current Status [phone]) At least 20 percent but less than 40 percent impaired, limited or restricted   Mobility: Walking and Moving Around Goal Status [phone]) At least 1 percent but less than 20 percent impaired, limited or restricted   Mobility: Walking and Moving Around Discharge Status [phone]) At least 20 percent but less than 40 percent impaired, limited or restricted      PHYSICAL THERAPY DISCHARGE SUMMARY  Visits from Start of Care: 14  Current functional level related to goals / functional  outcomes: Please see above.   Remaining deficits: Strength deficit remains.   Education / Equipment: HEP.  To have hardware removed and total knee performed.   Plan: Patient agrees to discharge.  Patient goals were partially met. Patient is being discharged due to the physician's request.  ?????          Fatoumata Albaugh, [place] MPT  07/27/2015, 1:53 PM  [facility] [address] [place], [place], [address] Phone: [phone]   Fax:  [phone]
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
## 2015-07-27 NOTE — Patient Instructions (Signed)
  Straight Leg Raise   Tighten stomach and slowly raise locked right leg __4__ inches from floor. Repeat __10-30__ times per set. Do __2__ sets per session. Do __2__ sessions per day.      Step-Down / Step-Up   Stand on stair step or __6__ inch stool. Slowly bend left leg, lowering other foot to floor. Return by straightening front leg. Repeat __10__ times per set. Do __2-3__ sets per session. Do __2-3__ sessions per day.   Strengthening: Hip Abduction (Side-Lying)   Tighten muscles on front of left thigh, then lift leg __5__ inches from surface, keeping knee locked.  Repeat __10__ times per set. Do __2__ sets per session. Do _2___ sessions per day.  Marland Kitchen

## 2015-08-04 ENCOUNTER — Other Ambulatory Visit (HOSPITAL_COMMUNITY): Payer: Self-pay | Admitting: *Deleted

## 2015-08-04 NOTE — Pre-Procedure Instructions (Signed)
    SUSANNA BENGE  08/04/2015      [id number] - [place], [place] - [address] AT [address] [place] [place] [address] Phone: [phone] Fax: [phone]    Your procedure is scheduled on Tuesday, August 08, 2015 at 10:15 AM.   Report to [facility] Entrance "A" Admitting Office at 8:15 AM.   Call this number if you have problems the morning of surgery: [phone]     Remember:  Do not eat food or drink liquids after midnight tonight.  Take these medicines the morning of surgery with A SIP OF WATER: Tylenol - if needed  How to Manage Your Diabetes Before Surgery   Why is it important to control my blood sugar before and after surgery?   Improving blood sugar levels before and after surgery helps healing and can limit problems.  A way of improving blood sugar control is eating a healthy diet by:  - Eating less sugar and carbohydrates  - Increasing activity/exercise  - Talk with your doctor about reaching your blood sugar goals  High blood sugars (greater than 180 mg/dL) can raise your risk of infections and slow down your recovery so you will need to focus on controlling your diabetes during the weeks before surgery.  Make sure that the doctor who takes care of your diabetes knows about your planned surgery including the date and location.  How do I manage my blood sugars before surgery?   Check your blood sugar at least 4 times a day, 2 days before surgery to make sure that they are not too high or low.    Check your blood sugar the morning of your surgery when you wake up and every 2 hours until you get to the Short-Stay unit.   Treat a low blood sugar (less than 70 mg/dL) with 1/2 cup of clear juice (cranberry or apple), 4 glucose tablets, OR glucose gel.   Recheck blood sugar in 15 minutes after treatment (to make sure it is greater than 70 mg/dL).  If blood sugar is not greater than 70 mg/dL  on re-check, call [phone] for further instructions.    Report your blood sugar to the Short-Stay nurse when you get to Short-Stay.  References:  [facility], 2007 "How to Manage your Diabetes Before and After Surgery".  What do I do about my diabetes medications?   Do not take oral diabetes medicines (pills) the morning of surgery.    Do not wear jewelry, make-up or nail polish.  Do not wear lotions, powders, or perfumes.  You may wear deodorant.  Do not shave 48 hours prior to surgery.    Do not bring valuables to the hospital.  [facility] is not responsible for any belongings or valuables.  Contacts, dentures or bridgework may not be worn into surgery.  Leave your suitcase in the car.  After surgery it may be brought to your room.  For patients admitted to the hospital, discharge time will be determined by your treatment team.  Patients discharged the day of surgery will not be allowed to drive home.   Special instructions:  See "Preparing for Surgery" Instruction sheet.   Please read over the following fact sheets that you were given. Pain Booklet, Coughing and Deep Breathing and Surgical Site Infection Prevention

## 2015-08-07 ENCOUNTER — Encounter (HOSPITAL_COMMUNITY): Payer: Self-pay

## 2015-08-07 ENCOUNTER — Encounter (HOSPITAL_COMMUNITY)
Admission: RE | Admit: 2015-08-07 | Discharge: 2015-08-07 | Disposition: A | Discharge status: Home / Self Care | Payer: Medicare Other | Source: Ambulatory Visit | Attending: Orthopedic Surgery | Admitting: Orthopedic Surgery

## 2015-08-07 DIAGNOSIS — E119 Type 2 diabetes mellitus without complications: Secondary | ICD-10-CM | POA: Diagnosis not present

## 2015-08-07 DIAGNOSIS — E669 Obesity, unspecified: Secondary | ICD-10-CM | POA: Diagnosis not present

## 2015-08-07 DIAGNOSIS — Z885 Allergy status to narcotic agent status: Secondary | ICD-10-CM | POA: Diagnosis not present

## 2015-08-07 DIAGNOSIS — T8484XA Pain due to internal orthopedic prosthetic devices, implants and grafts, initial encounter: Secondary | ICD-10-CM | POA: Diagnosis not present

## 2015-08-07 DIAGNOSIS — Z6830 Body mass index (BMI) 30.0-30.9, adult: Secondary | ICD-10-CM | POA: Diagnosis not present

## 2015-08-07 DIAGNOSIS — M12561 Traumatic arthropathy, right knee: Secondary | ICD-10-CM | POA: Diagnosis not present

## 2015-08-07 DIAGNOSIS — K219 Gastro-esophageal reflux disease without esophagitis: Secondary | ICD-10-CM | POA: Diagnosis not present

## 2015-08-07 DIAGNOSIS — Z833 Family history of diabetes mellitus: Secondary | ICD-10-CM | POA: Diagnosis not present

## 2015-08-07 DIAGNOSIS — S82141S Displaced bicondylar fracture of right tibia, sequela: Secondary | ICD-10-CM | POA: Diagnosis not present

## 2015-08-07 DIAGNOSIS — X58XXXS Exposure to other specified factors, sequela: Secondary | ICD-10-CM | POA: Diagnosis not present

## 2015-08-07 DIAGNOSIS — Z884 Allergy status to anesthetic agent status: Secondary | ICD-10-CM | POA: Diagnosis not present

## 2015-08-07 DIAGNOSIS — Y838 Other surgical procedures as the cause of abnormal reaction of the patient, or of later complication, without mention of misadventure at the time of the procedure: Secondary | ICD-10-CM | POA: Diagnosis not present

## 2015-08-07 DIAGNOSIS — I10 Essential (primary) hypertension: Secondary | ICD-10-CM | POA: Diagnosis not present

## 2015-08-07 LAB — CBC
HCT: 32.1 % — ABNORMAL LOW (ref 36.0–46.0)
Hemoglobin: 10 g/dL — ABNORMAL LOW (ref 12.0–15.0)
MCH: 26.2 pg (ref 26.0–34.0)
MCHC: 31.2 g/dL (ref 30.0–36.0)
MCV: 84.3 fL (ref 78.0–100.0)
PLATELETS: 324 10*3/uL (ref 150–400)
RBC: 3.81 MIL/uL — AB (ref 3.87–5.11)
RDW: 14.5 % (ref 11.5–15.5)
WBC: 4.8 10*3/uL (ref 4.0–10.5)

## 2015-08-07 LAB — BASIC METABOLIC PANEL
Anion gap: 8 (ref 5–15)
BUN: 16 mg/dL (ref 6–20)
CHLORIDE: 101 mmol/L (ref 101–111)
CO2: 29 mmol/L (ref 22–32)
CREATININE: 0.95 mg/dL (ref 0.44–1.00)
Calcium: 9.7 mg/dL (ref 8.9–10.3)
GFR calc non Af Amer: 59 mL/min — ABNORMAL LOW (ref >60)
GLUCOSE: 163 mg/dL — AB (ref 65–99)
Potassium: 3.1 mmol/L — ABNORMAL LOW (ref 3.5–5.1)
Sodium: 138 mmol/L (ref 135–145)

## 2015-08-07 LAB — GLUCOSE, CAPILLARY: Glucose-Capillary: 145 mg/dL — ABNORMAL HIGH (ref 65–99)

## 2015-08-07 MED ORDER — CEFAZOLIN SODIUM-DEXTROSE 2-3 GM-% IV SOLR
2.0000 g | INTRAVENOUS | Status: AC
  Administered 2015-08-08: 2 g via INTRAVENOUS
  Filled 2015-08-07: qty 50

## 2015-08-07 MED ORDER — CHLORHEXIDINE GLUCONATE 4 % EX LIQD: 60.0000 mL | Freq: Once | CUTANEOUS | Status: DC

## 2015-08-07 NOTE — Progress Notes (Signed)
PCP: Dr. Lucia Gaskins in [place], [place] : will request echo/ekg(previous),hgb A1C results.  Pt. Stated had an echo due to ankle swelling.  Stated echo normal.  States blood sugars run 140"s. Previous ran 160's due to  Being on steroids.

## 2015-08-08 ENCOUNTER — Encounter (HOSPITAL_COMMUNITY): Payer: Self-pay | Admitting: Certified Registered Nurse Anesthetist

## 2015-08-08 ENCOUNTER — Encounter (HOSPITAL_COMMUNITY)
Admission: RE | Disposition: A | Discharge status: Home / Self Care | Payer: Self-pay | Source: Ambulatory Visit | Attending: Orthopedic Surgery

## 2015-08-08 ENCOUNTER — Ambulatory Visit (HOSPITAL_COMMUNITY): Payer: Medicare Other | Admitting: Anesthesiology

## 2015-08-08 ENCOUNTER — Ambulatory Visit (HOSPITAL_COMMUNITY)
Admission: RE | Admit: 2015-08-08 | Discharge: 2015-08-08 | Disposition: A | Discharge status: Home / Self Care | Payer: Medicare Other | Source: Ambulatory Visit | Attending: Orthopedic Surgery | Admitting: Orthopedic Surgery

## 2015-08-08 ENCOUNTER — Ambulatory Visit (HOSPITAL_COMMUNITY): Payer: Medicare Other

## 2015-08-08 DIAGNOSIS — T8484XA Pain due to internal orthopedic prosthetic devices, implants and grafts, initial encounter: Secondary | ICD-10-CM | POA: Diagnosis not present

## 2015-08-08 DIAGNOSIS — Z833 Family history of diabetes mellitus: Secondary | ICD-10-CM | POA: Insufficient documentation

## 2015-08-08 DIAGNOSIS — E119 Type 2 diabetes mellitus without complications: Secondary | ICD-10-CM | POA: Diagnosis not present

## 2015-08-08 DIAGNOSIS — M12561 Traumatic arthropathy, right knee: Secondary | ICD-10-CM | POA: Diagnosis not present

## 2015-08-08 DIAGNOSIS — K219 Gastro-esophageal reflux disease without esophagitis: Secondary | ICD-10-CM | POA: Insufficient documentation

## 2015-08-08 DIAGNOSIS — Z419 Encounter for procedure for purposes other than remedying health state, unspecified: Secondary | ICD-10-CM

## 2015-08-08 DIAGNOSIS — Z884 Allergy status to anesthetic agent status: Secondary | ICD-10-CM | POA: Insufficient documentation

## 2015-08-08 DIAGNOSIS — Z6830 Body mass index (BMI) 30.0-30.9, adult: Secondary | ICD-10-CM | POA: Insufficient documentation

## 2015-08-08 DIAGNOSIS — I10 Essential (primary) hypertension: Secondary | ICD-10-CM | POA: Insufficient documentation

## 2015-08-08 DIAGNOSIS — Y838 Other surgical procedures as the cause of abnormal reaction of the patient, or of later complication, without mention of misadventure at the time of the procedure: Secondary | ICD-10-CM | POA: Insufficient documentation

## 2015-08-08 DIAGNOSIS — S82141S Displaced bicondylar fracture of right tibia, sequela: Secondary | ICD-10-CM | POA: Diagnosis not present

## 2015-08-08 DIAGNOSIS — X58XXXS Exposure to other specified factors, sequela: Secondary | ICD-10-CM | POA: Insufficient documentation

## 2015-08-08 DIAGNOSIS — E669 Obesity, unspecified: Secondary | ICD-10-CM | POA: Insufficient documentation

## 2015-08-08 DIAGNOSIS — Z885 Allergy status to narcotic agent status: Secondary | ICD-10-CM | POA: Insufficient documentation

## 2015-08-08 HISTORY — PX: HARDWARE REMOVAL: SHX979

## 2015-08-08 LAB — GLUCOSE, CAPILLARY
Glucose-Capillary: 153 mg/dL — ABNORMAL HIGH (ref 65–99)
Glucose-Capillary: 152 mg/dL — ABNORMAL HIGH (ref 65–99)

## 2015-08-08 SURGERY — REMOVAL, HARDWARE
Anesthesia: General | Site: Knee | Laterality: Right

## 2015-08-08 MED ORDER — DIPHENHYDRAMINE HCL 50 MG/ML IJ SOLN
INTRAMUSCULAR | Status: AC
  Filled 2015-08-08: qty 1

## 2015-08-08 MED ORDER — DIPHENHYDRAMINE HCL 50 MG/ML IJ SOLN
INTRAMUSCULAR | Status: DC | PRN
  Administered 2015-08-08: 12.5 mg via INTRAVENOUS

## 2015-08-08 MED ORDER — EPHEDRINE SULFATE 50 MG/ML IJ SOLN
INTRAMUSCULAR | Status: DC | PRN
  Administered 2015-08-08: 10 mg via INTRAVENOUS
  Administered 2015-08-08: 5 mg via INTRAVENOUS
  Administered 2015-08-08 (×2): 10 mg via INTRAVENOUS

## 2015-08-08 MED ORDER — FENTANYL CITRATE (PF) 250 MCG/5ML IJ SOLN
INTRAMUSCULAR | Status: AC
  Filled 2015-08-08: qty 5

## 2015-08-08 MED ORDER — FENTANYL CITRATE (PF) 100 MCG/2ML IJ SOLN
INTRAMUSCULAR | Status: AC
  Filled 2015-08-08: qty 2

## 2015-08-08 MED ORDER — ONDANSETRON HCL 4 MG PO TABS: 4.0000 mg | ORAL_TABLET | Freq: Three times a day (TID) | ORAL | Status: DC | PRN

## 2015-08-08 MED ORDER — MORPHINE SULFATE (PF) 4 MG/ML IV SOLN
INTRAVENOUS | Status: DC | PRN
  Administered 2015-08-08: 2 mg via SUBCUTANEOUS

## 2015-08-08 MED ORDER — PROMETHAZINE HCL 25 MG/ML IJ SOLN: 6.2500 mg | INTRAMUSCULAR | Status: DC | PRN

## 2015-08-08 MED ORDER — MIDAZOLAM HCL 2 MG/2ML IJ SOLN
INTRAMUSCULAR | Status: AC
  Filled 2015-08-08: qty 2

## 2015-08-08 MED ORDER — METHYLPREDNISOLONE ACETATE 80 MG/ML IJ SUSP
INTRAMUSCULAR | Status: DC | PRN
  Administered 2015-08-08: 80 mg via INTRA_ARTICULAR

## 2015-08-08 MED ORDER — SUCCINYLCHOLINE CHLORIDE 20 MG/ML IJ SOLN
INTRAMUSCULAR | Status: DC | PRN
  Administered 2015-08-08: 100 mg via INTRAVENOUS

## 2015-08-08 MED ORDER — MORPHINE SULFATE (PF) 2 MG/ML IV SOLN
INTRAVENOUS | Status: AC
  Filled 2015-08-08: qty 1

## 2015-08-08 MED ORDER — ONDANSETRON HCL 4 MG/2ML IJ SOLN
INTRAMUSCULAR | Status: DC | PRN
  Administered 2015-08-08: 4 mg via INTRAVENOUS

## 2015-08-08 MED ORDER — DEXAMETHASONE SODIUM PHOSPHATE 4 MG/ML IJ SOLN
INTRAMUSCULAR | Status: DC | PRN
  Administered 2015-08-08: 4 mg via INTRAVENOUS

## 2015-08-08 MED ORDER — LACTATED RINGERS IV SOLN
INTRAVENOUS | Status: DC
  Administered 2015-08-08 (×2): via INTRAVENOUS

## 2015-08-08 MED ORDER — HYDROCODONE-ACETAMINOPHEN 5-325 MG PO TABS: 1.0000 | ORAL_TABLET | Freq: Four times a day (QID) | ORAL | Status: DC | PRN

## 2015-08-08 MED ORDER — PROPOFOL 10 MG/ML IV BOLUS
INTRAVENOUS | Status: DC | PRN
  Administered 2015-08-08: 150 mg via INTRAVENOUS

## 2015-08-08 MED ORDER — NABUMETONE 500 MG PO TABS: 500.0000 mg | ORAL_TABLET | Freq: Two times a day (BID) | ORAL | Status: DC | PRN

## 2015-08-08 MED ORDER — HYDROMORPHONE HCL 1 MG/ML IJ SOLN: 0.2500 mg | INTRAMUSCULAR | Status: DC | PRN

## 2015-08-08 MED ORDER — METHYLPREDNISOLONE ACETATE 80 MG/ML IJ SUSP
INTRAMUSCULAR | Status: AC
  Filled 2015-08-08: qty 1

## 2015-08-08 MED ORDER — MIDAZOLAM HCL 5 MG/5ML IJ SOLN
INTRAMUSCULAR | Status: DC | PRN
  Administered 2015-08-08: 1 mg via INTRAVENOUS

## 2015-08-08 MED ORDER — MIDAZOLAM HCL 2 MG/2ML IJ SOLN
INTRAMUSCULAR | Status: AC
  Filled 2015-08-08: qty 4

## 2015-08-08 MED ORDER — FENTANYL CITRATE (PF) 100 MCG/2ML IJ SOLN
INTRAMUSCULAR | Status: DC | PRN
  Administered 2015-08-08 (×2): 25 ug via INTRAVENOUS
  Administered 2015-08-08: 50 ug via INTRAVENOUS
  Administered 2015-08-08: 75 ug via INTRAVENOUS

## 2015-08-08 MED ORDER — DEXAMETHASONE SODIUM PHOSPHATE 4 MG/ML IJ SOLN
INTRAMUSCULAR | Status: AC
  Filled 2015-08-08: qty 1

## 2015-08-08 MED ORDER — 0.9 % SODIUM CHLORIDE (POUR BTL) OPTIME
TOPICAL | Status: DC | PRN
  Administered 2015-08-08: 1000 mL

## 2015-08-08 SURGICAL SUPPLY — 67 items
BANDAGE ELASTIC 4 VELCRO ST LF (GAUZE/BANDAGES/DRESSINGS) ×3 IMPLANT
BANDAGE ELASTIC 6 VELCRO ST LF (GAUZE/BANDAGES/DRESSINGS) ×1 IMPLANT
BANDAGE ESMARK 6X9 LF (GAUZE/BANDAGES/DRESSINGS) ×1 IMPLANT
BNDG CMPR 9X6 STRL LF SNTH (GAUZE/BANDAGES/DRESSINGS)
BNDG COHESIVE 6X5 TAN STRL LF (GAUZE/BANDAGES/DRESSINGS) ×3 IMPLANT
BNDG ESMARK 6X9 LF (GAUZE/BANDAGES/DRESSINGS)
BNDG GAUZE ELAST 4 BULKY (GAUZE/BANDAGES/DRESSINGS) ×2 IMPLANT
BRUSH SCRUB DISP (MISCELLANEOUS) ×8 IMPLANT
CANISTER SUCTION WELLS/JOHNSON (MISCELLANEOUS) ×2 IMPLANT
CLEANER TIP ELECTROSURG 2X2 (MISCELLANEOUS) ×1 IMPLANT
CLOSURE WOUND 1/2 X4 (GAUZE/BANDAGES/DRESSINGS)
COVER SURGICAL LIGHT HANDLE (MISCELLANEOUS) ×4 IMPLANT
CUFF TOURNIQUET SINGLE 18IN (TOURNIQUET CUFF) IMPLANT
CUFF TOURNIQUET SINGLE 24IN (TOURNIQUET CUFF) IMPLANT
CUFF TOURNIQUET SINGLE 34IN LL (TOURNIQUET CUFF) IMPLANT
DRAPE C-ARM 42X72 X-RAY (DRAPES) ×2 IMPLANT
DRAPE C-ARMOR (DRAPES) ×3 IMPLANT
DRAPE OEC MINIVIEW 54X84 (DRAPES) ×1 IMPLANT
DRAPE U-SHAPE 47X51 STRL (DRAPES) ×3 IMPLANT
DRSG ADAPTIC 3X8 NADH LF (GAUZE/BANDAGES/DRESSINGS) ×1 IMPLANT
DRSG MEPILEX BORDER 4X12 (GAUZE/BANDAGES/DRESSINGS) ×2 IMPLANT
ELECT REM PT RETURN 9FT ADLT (ELECTROSURGICAL) ×3
ELECTRODE REM PT RTRN 9FT ADLT (ELECTROSURGICAL) ×1 IMPLANT
EVACUATOR 1/8 PVC DRAIN (DRAIN) IMPLANT
GAUZE SPONGE 4X4 12PLY STRL (GAUZE/BANDAGES/DRESSINGS) ×3 IMPLANT
GLOVE BIO SURGEON STRL SZ 6.5 (GLOVE) ×4 IMPLANT
GLOVE BIO SURGEON STRL SZ7.5 (GLOVE) ×3 IMPLANT
GLOVE BIO SURGEON STRL SZ8 (GLOVE) ×3 IMPLANT
GLOVE BIO SURGEONS STRL SZ 6.5 (GLOVE) ×4
GLOVE BIOGEL PI IND STRL 7.5 (GLOVE) ×1 IMPLANT
GLOVE BIOGEL PI IND STRL 8 (GLOVE) ×1 IMPLANT
GLOVE BIOGEL PI INDICATOR 7.5 (GLOVE) ×2
GLOVE BIOGEL PI INDICATOR 8 (GLOVE) ×2
GOWN STRL REUS W/ TWL LRG LVL3 (GOWN DISPOSABLE) ×2 IMPLANT
GOWN STRL REUS W/ TWL XL LVL3 (GOWN DISPOSABLE) ×1 IMPLANT
GOWN STRL REUS W/TWL LRG LVL3 (GOWN DISPOSABLE) ×6
GOWN STRL REUS W/TWL XL LVL3 (GOWN DISPOSABLE) ×3
KIT BASIN OR (CUSTOM PROCEDURE TRAY) ×3 IMPLANT
KIT ROOM TURNOVER OR (KITS) ×3 IMPLANT
MANIFOLD NEPTUNE II (INSTRUMENTS) ×1 IMPLANT
NDL 18GX1X1/2 (RX/OR ONLY) (NEEDLE) IMPLANT
NEEDLE 18GX1X1/2 (RX/OR ONLY) (NEEDLE) ×3 IMPLANT
NEEDLE 22X1 1/2 (OR ONLY) (NEEDLE) ×2 IMPLANT
NS IRRIG 1000ML POUR BTL (IV SOLUTION) ×3 IMPLANT
PACK ORTHO EXTREMITY (CUSTOM PROCEDURE TRAY) ×3 IMPLANT
PAD ARMBOARD 7.5X6 YLW CONV (MISCELLANEOUS) ×6 IMPLANT
PADDING CAST COTTON 6X4 STRL (CAST SUPPLIES) ×3 IMPLANT
SPONGE LAP 18X18 X RAY DECT (DISPOSABLE) ×3 IMPLANT
SPONGE SCRUB IODOPHOR (GAUZE/BANDAGES/DRESSINGS) ×7 IMPLANT
STAPLER VISISTAT 35W (STAPLE) ×2 IMPLANT
STOCKINETTE IMPERVIOUS LG (DRAPES) ×3 IMPLANT
STRIP CLOSURE SKIN 1/2X4 (GAUZE/BANDAGES/DRESSINGS) IMPLANT
SUCTION FRAZIER TIP 10 FR DISP (SUCTIONS) ×2 IMPLANT
SUT ETHILON 2 0 FS 18 (SUTURE) ×4 IMPLANT
SUT ETHILON 3 0 PS 1 (SUTURE) ×2 IMPLANT
SUT PDS AB 2-0 CT1 27 (SUTURE) IMPLANT
SUT VIC AB 0 CT1 27 (SUTURE) ×3
SUT VIC AB 0 CT1 27XBRD ANBCTR (SUTURE) IMPLANT
SUT VIC AB 2-0 CT1 27 (SUTURE) ×3
SUT VIC AB 2-0 CT1 TAPERPNT 27 (SUTURE) IMPLANT
SYR CONTROL 10ML LL (SYRINGE) ×2 IMPLANT
TOWEL OR 17X24 6PK STRL BLUE (TOWEL DISPOSABLE) ×6 IMPLANT
TOWEL OR 17X26 10 PK STRL BLUE (TOWEL DISPOSABLE) ×6 IMPLANT
TUBE CONNECTING 12'X1/4 (SUCTIONS) ×1
TUBE CONNECTING 12X1/4 (SUCTIONS) ×2 IMPLANT
UNDERPAD 30X30 INCONTINENT (UNDERPADS AND DIAPERS) ×5 IMPLANT
YANKAUER SUCT BULB TIP NO VENT (SUCTIONS) ×3 IMPLANT

## 2015-08-08 NOTE — Anesthesia Procedure Notes (Signed)
Procedure Name: Intubation Date/Time: 08/08/2015 10:06 AM Performed by: Ollen Bowl Pre-anesthesia Checklist: Patient identified, Emergency Drugs available, Suction available, Patient being monitored and Timeout performed Patient Re-evaluated:Patient Re-evaluated prior to inductionOxygen Delivery Method: Circle system utilized and Simple face mask Preoxygenation: Pre-oxygenation with 100% oxygen Intubation Type: IV induction Ventilation: Mask ventilation without difficulty Laryngoscope Size: Miller and 2 Grade View: Grade I Tube type: Oral Tube size: 7.5 mm Number of attempts: 1 Airway Equipment and Method: Patient positioned with wedge pillow and Stylet Placement Confirmation: ETT inserted through vocal cords under direct vision,  positive ETCO2 and breath sounds checked- equal and bilateral Secured at: 22 cm Tube secured with: Tape Dental Injury: Teeth and Oropharynx as per pre-operative assessment

## 2015-08-08 NOTE — H&P (Signed)
[facility]   Chief Complaint: symptomatic HW R knee, R knee post traumatic arthritis HPI:   74 y/o female well known to OTS for complex R tibial plateau fracture. Pts fracture has unfortunately gone on to subside and pt has developed symptomatic arthritis.   Pt has tried conservative measures but these have all failed. Pt presents today for [facility] R knee in preparation for TKA   Past Medical History  Diagnosis Date  . Arthritis   . Seasonal allergies   . Diabetes mellitus   . Hypertension   . GERD (gastroesophageal reflux disease)   . Obesity   . Colon polyp   . Complication of anesthesia   . PONV (postoperative nausea and vomiting)     Past Surgical History  Procedure Laterality Date  . Leg surgery  1998    left leg and ankle fracture  . Tonsillectomy and adenoidectomy  1948  . Tubal ligation  1972  . Knee arthroscopy Right   . Eye surgery Bilateral 2008    for macular holes; had lens implants  . Tibia fracture surgery Right 03/30/2015  . Orif finger fracture Left   . Orif tibia plateau Right 03/28/2015    Procedure: OPEN REDUCTION INTERNAL FIXATION (ORIF) RIGHT TIBIAL PLATEAU;  Surgeon: Altamese Powers, MD;  Location: [place];  Service: Orthopedics;  Laterality: Right;  . Injection knee Left 03/28/2015    Procedure: KNEE INJECTION;  Surgeon: Altamese Cannonsburg, MD;  Location: [place];  Service: Orthopedics;  Laterality: Left;  . Open reduction internal fixation (orif) hand Left 03/28/2015    Procedure: OPEN REDUCTION INTERNAL FIXATION METACARPAL FRACTURE LEFT RING FINGER;  Surgeon: Roseanne Kaufman, MD;  Location: [place];  Service: Orthopedics;  Laterality: Left;    Family History  Problem Relation Age of Onset  . Diabetes Father   . Pancreatic cancer Father   . Colon cancer Father 63  . Arthritis Mother    Social History:  reports that she has never smoked. She has never used smokeless tobacco. She reports that she does not drink alcohol or use illicit  drugs.  Allergies:  Allergies  Allergen Reactions  . Codeine Nausea And Vomiting  . Lidocaine Other (See Comments)    wheezing  . Propoxyphene N-Acetaminophen Nausea And Vomiting    No prescriptions prior to admission    Results for orders placed or performed during the hospital encounter of 08/07/15 (from the past 48 hour(s))  Glucose, capillary     Status: Abnormal   Collection Time: 08/07/15  8:31 AM  Result Value Ref Range   Glucose-Capillary 145 (H) 65 - 99 mg/dL  Basic metabolic panel     Status: Abnormal   Collection Time: 08/07/15  9:05 AM  Result Value Ref Range   Sodium 138 135 - 145 mmol/L   Potassium 3.1 (L) 3.5 - 5.1 mmol/L   Chloride 101 101 - 111 mmol/L   CO2 29 22 - 32 mmol/L   Glucose, Bld 163 (H) 65 - 99 mg/dL   BUN 16 6 - 20 mg/dL   Creatinine, Ser 0.95 0.44 - 1.00 mg/dL   Calcium 9.7 8.9 - 10.3 mg/dL   GFR calc non Af Amer 59 (L) >60 mL/min   GFR calc Af Amer >60 >60 mL/min    Comment: (NOTE) The eGFR has been calculated using the CKD EPI equation. This calculation has not been validated in all clinical situations. eGFR's persistently <60 mL/min signify possible Chronic Kidney Disease.    Anion gap 8 5 -  15  CBC     Status: Abnormal   Collection Time: 08/07/15  9:05 AM  Result Value Ref Range   WBC 4.8 4.0 - 10.5 K/uL   RBC 3.81 (L) 3.87 - 5.11 MIL/uL   Hemoglobin 10.0 (L) 12.0 - 15.0 g/dL   HCT 32.1 (L) 36.0 - 46.0 %   MCV 84.3 78.0 - 100.0 fL   MCH 26.2 26.0 - 34.0 pg   MCHC 31.2 30.0 - 36.0 g/dL   RDW 14.5 11.5 - 15.5 %   Platelets 324 150 - 400 K/uL   No results found.  Review of Systems  Constitutional: Negative for fever and chills.  Respiratory: Negative for shortness of breath and wheezing.   Cardiovascular: Negative for chest pain and palpitations.  Gastrointestinal: Negative for nausea, vomiting and abdominal pain.  Neurological: Negative for tingling, sensory change and headaches.    There were no vitals taken for this  visit. Physical Exam  Constitutional: She is oriented to person, place, and time. She appears well-developed and well-nourished. No distress.  HENT:  Head: Normocephalic and atraumatic.  Cardiovascular: Normal rate and regular rhythm.   Respiratory: Effort normal.  Musculoskeletal:  Right Lower Extremity              DPN, SPN, TN sensation intact EHL, FHL, AT, PT, peroneals, gastroc motor intact Ext warm + DP pulse  No pain with passive stretch  Compartments soft Swelling mild              Surgical wounds stable             + joint line pain              + pain over hardware                Neurological: She is alert and oriented to person, place, and time.  Skin: Skin is warm.  Psychiatric: She has a normal mood and affect. Her behavior is normal.     Assessment/Plan  74 y/o female with symptomatic HW R knee and R knee arthritis   OR for [facility]  outpt procedure  Risks and benefits reviewed, pt wishes to proceed   Jari Pigg, PA-C [facility] [phone] (P) 08/08/2015, 7:50 AM

## 2015-08-08 NOTE — Anesthesia Postprocedure Evaluation (Signed)
  Anesthesia Post-op Note  Patient: Loretta Barnett  Procedure(s) Performed: Procedure(s): HARDWARE REMOVAL RIGHT TIBIAL PLATEAU (Right)  Patient Location: PACU  Anesthesia Type:General  Level of Consciousness: awake and alert   Airway and Oxygen Therapy: Patient Spontanous Breathing  Post-op Pain: mild  Post-op Assessment: Post-op Vital signs reviewed              Post-op Vital Signs: stable  Last Vitals:  Filed Vitals:   08/08/15 1239  BP: 165/68  Pulse: 94  Temp:   Resp: 18    Complications: No apparent anesthesia complications

## 2015-08-08 NOTE — Transfer of Care (Signed)
Immediate Anesthesia Transfer of Care Note  Patient: Loretta Barnett  Procedure(s) Performed: Procedure(s): HARDWARE REMOVAL RIGHT TIBIAL PLATEAU (Right)  Patient Location: PACU  Anesthesia Type:General  Level of Consciousness: awake, alert  and oriented  Airway & Oxygen Therapy: Patient Spontanous Breathing and Patient connected to nasal cannula oxygen  Post-op Assessment: Report given to RN, Post -op Vital signs reviewed and stable and Patient moving all extremities X 4  Post vital signs: Reviewed and stable  Last Vitals: There were no vitals filed for this visit.  Complications: No apparent anesthesia complications

## 2015-08-08 NOTE — Anesthesia Preprocedure Evaluation (Addendum)
Anesthesia Evaluation  Patient identified by MRN, date of birth, ID band Patient awake    Reviewed: Allergy & Precautions, NPO status , Patient's Chart, lab work & pertinent test results  History of Anesthesia Complications (+) PONV and history of anesthetic complications  Airway Mallampati: II  TM Distance: >3 FB Neck ROM: Full    Dental  (+) Teeth Intact, Dental Advisory Given   Pulmonary neg pulmonary ROS,  breath sounds clear to auscultation        Cardiovascular hypertension, Pt. on medications Rhythm:Regular Rate:Normal     Neuro/Psych negative neurological ROS     GI/Hepatic Neg liver ROS, GERD-  ,  Endo/Other  diabetes  Renal/GU      Musculoskeletal  (+) Arthritis -,   Abdominal   Peds  Hematology  (+) anemia , Hgb 8.5   Anesthesia Other Findings   Reproductive/Obstetrics                            Anesthesia Physical Anesthesia Plan  ASA: II  Anesthesia Plan: General   Post-op Pain Management:    Induction: Intravenous  Airway Management Planned: Oral ETT  Additional Equipment:   Intra-op Plan:   Post-operative Plan: Extubation in OR  Informed Consent: I have reviewed the patients History and Physical, chart, labs and discussed the procedure including the risks, benefits and alternatives for the proposed anesthesia with the patient or authorized representative who has indicated his/her understanding and acceptance.   Dental advisory given  Plan Discussed with: CRNA and Surgeon  Anesthesia Plan Comments:        Anesthesia Quick Evaluation

## 2015-08-08 NOTE — Progress Notes (Signed)
Orthopedic Tech Progress Note Patient Details:  Loretta Barnett 07/23/1941 [id number]  Ortho Devices Type of Ortho Device: Postop shoe/boot Ortho Device/Splint Interventions: Application   [name] 08/08/2015, 12:13 PM

## 2015-08-08 NOTE — Brief Op Note (Signed)
08/08/2015  11:13 AM  PATIENT:  Loretta Barnett  74 y.o. female  PRE-OPERATIVE DIAGNOSIS:   1. SYMPTOMATIC HARDWARE, RIGHT TIBIAL PLATEAU 2. RIGHT KNEE ARTHRITIS  POST-OPERATIVE DIAGNOSIS:   1. SYMPTOMATIC HARDWARE, RIGHT TIBIAL PLATEAU 2. RIGHT KNEE ARTHRITIS  PROCEDURE:  Procedure(s): 1. HARDWARE REMOVAL RIGHT TIBIAL PLATEAU (Right) 2. RIGHT KNEE ARTHRITIS 3. STRESS FLOUROSCOPY  SURGEON:  Surgeon(s) and Role:    * Altamese Leonardo, MD - Primary  PHYSICIAN ASSISTANT: Ainsley Spinner, PA-C  ANESTHESIA:   general  I/O:     SPECIMEN:  No Specimen  TOURNIQUET:  * No tourniquets in log *  DICTATION: .Other Dictation: Dictation Number [id number]

## 2015-08-08 NOTE — Discharge Instructions (Signed)
Orthopaedic Trauma Service Discharge Instructions   General Discharge Instructions  WEIGHT BEARING STATUS: Weightbearing as tolerated  RANGE OF MOTION/ACTIVITY:as tolerated  PAIN MEDICATION USE AND EXPECTATIONS  You have likely been given narcotic medications to help control your pain.  After a traumatic event that results in an fracture (broken bone) with or without surgery, it is ok to use narcotic pain medications to help control one's pain.  We understand that everyone responds to pain differently and each individual patient will be evaluated on a regular basis for the continued need for narcotic medications. Ideally, narcotic medication use should last no more than 6-8 weeks (coinciding with fracture healing).   As a patient it is your responsibility as well to monitor narcotic medication use and report the amount and frequency you use these medications when you come to your office visit.   We would also advise that if you are using narcotic medications, you should take a dose prior to therapy to maximize you participation.  IF YOU ARE ON NARCOTIC MEDICATIONS IT IS NOT PERMISSIBLE TO OPERATE A MOTOR VEHICLE (MOTORCYCLE/CAR/TRUCK/MOPED) OR HEAVY MACHINERY DO NOT MIX NARCOTICS WITH OTHER CNS (CENTRAL NERVOUS SYSTEM) DEPRESSANTS SUCH AS ALCOHOL  Diet: as you were eating previously.  Can use over the counter stool softeners and bowel preparations, such as Miralax, to help with bowel movements.  Narcotics can be constipating.  Be sure to drink plenty of fluids  Wound Care: daily dressing changes starting on 08/11/2015, ok to remove ace wrap when you get home. See detailed instructions below   STOP SMOKING OR USING NICOTINE PRODUCTS!!!!  As discussed nicotine severely impairs your body's ability to heal surgical and traumatic wounds but also impairs bone healing.  Wounds and bone heal by forming microscopic blood vessels (angiogenesis) and nicotine is a vasoconstrictor (essentially, shrinks blood  vessels).  Therefore, if vasoconstriction occurs to these microscopic blood vessels they essentially disappear and are unable to deliver necessary nutrients to the healing tissue.  This is one modifiable factor that you can do to dramatically increase your chances of healing your injury.    (This means no smoking, no nicotine gum, patches, etc)  DO NOT USE NONSTEROIDAL ANTI-INFLAMMATORY DRUGS (NSAID'S)  Using products such as Advil (ibuprofen), Aleve (naproxen), Motrin (ibuprofen) for additional pain control during fracture healing can delay and/or prevent the healing response.  If you would like to take over the counter (OTC) medication, Tylenol (acetaminophen) is ok.  However, some narcotic medications that are given for pain control contain acetaminophen as well. Therefore, you should not exceed more than 4000 mg of tylenol in a day if you do not have liver disease.  Also note that there are may OTC medicines, such as cold medicines and allergy medicines that my contain tylenol as well.  If you have any questions about medications and/or interactions please ask your doctor/PA or your pharmacist.      ICE AND ELEVATE INJURED/OPERATIVE EXTREMITY  Using ice and elevating the injured extremity above your heart can help with swelling and pain control.  Icing in a pulsatile fashion, such as 20 minutes on and 20 minutes off, can be followed.    Do not place ice directly on skin. Make sure there is a barrier between to skin and the ice pack.    Using frozen items such as frozen peas works well as the conform nicely to the are that needs to be iced.  USE AN ACE WRAP OR TED HOSE FOR SWELLING CONTROL  In addition  to icing and elevation, Ace wraps or TED hose are used to help limit and resolve swelling.  It is recommended to use Ace wraps or TED hose until you are informed to stop.    When using Ace Wraps start the wrapping distally (farthest away from the body) and wrap proximally (closer to the  body)   Example: If you had surgery on your leg or thing and you do not have a splint on, start the ace wrap at the toes and work your way up to the thigh        If you had surgery on your upper extremity and do not have a splint on, start the ace wrap at your fingers and work your way up to the upper arm  IF YOU ARE IN A SPLINT OR CAST DO NOT [place]   If your splint gets wet for any reason please contact the office immediately. You may shower in your splint or cast as long as you keep it dry.  This can be done by wrapping in a cast cover or garbage back (or similar)  Do Not stick any thing down your splint or cast such as pencils, money, or hangers to try and scratch yourself with.  If you feel itchy take benadryl as prescribed on the bottle for itching  IF YOU ARE IN A CAM BOOT (BLACK BOOT)  You may remove boot periodically. Perform daily dressing changes as noted below.  Wash the liner of the boot regularly and wear a sock when wearing the boot. It is recommended that you sleep in the boot until told otherwise  CALL THE OFFICE WITH ANY QUESTIONS OR CONCERTS: [phone]     Discharge Pin Site Instructions  Dress pins daily with Kerlix roll starting on POD 2. Wrap the Kerlix so that it tamps the skin down around the pin-skin interface to prevent/limit motion of the skin relative to the pin.  (Pin-skin motion is the primary cause of pain and infection related to external fixator pin sites).  Remove any crust or coagulum that may obstruct drainage with a saline moistened gauze or soap and water.  After POD 3, if there is no discernable drainage on the pin site dressing, the interval for change can by increased to every other day.  You may shower with the fixator, cleaning all pin sites gently with soap and water.  If you have a surgical wound this needs to be completely dry and without drainage before showering.  The extremity can be lifted by the fixator to facilitate  wound care and transfers.  Notify the office/Doctor if you experience increasing drainage, redness, or pain from a pin site, or if you notice purulent (thick, snot-like) drainage.  Discharge Wound Care Instructions  Do NOT apply any ointments, solutions or lotions to pin sites or surgical wounds.  These prevent needed drainage and even though solutions like hydrogen peroxide kill bacteria, they also damage cells lining the pin sites that help fight infection.  Applying lotions or ointments can keep the wounds moist and can cause them to breakdown and open up as well. This can increase the risk for infection. When in doubt call the office.  Surgical incisions should be dressed daily.  If any drainage is noted, use one layer of adaptic, then gauze, Kerlix, and an ace wrap.  Once the incision is completely dry and without drainage, it may be left open to air out.  Showering may begin 36-48 hours later.  Cleaning gently with soap and water.  Traumatic wounds should be dressed daily as well.    One layer of adaptic, gauze, Kerlix, then ace wrap.  The adaptic can be discontinued once the draining has ceased    If you have a wet to dry dressing: wet the gauze with saline the squeeze as much saline out so the gauze is moist (not soaking wet), place moistened gauze over wound, then place a dry gauze over the moist one, followed by Kerlix wrap, then ace wrap.

## 2015-08-09 ENCOUNTER — Encounter (HOSPITAL_COMMUNITY): Payer: Self-pay | Admitting: Orthopedic Surgery
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
## 2015-10-17 DIAGNOSIS — M25561 Pain in right knee: Secondary | ICD-10-CM | POA: Insufficient documentation
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
## 2015-10-31 ENCOUNTER — Other Ambulatory Visit: Payer: Self-pay | Admitting: Orthopedic Surgery

## 2015-11-03 ENCOUNTER — Encounter (HOSPITAL_COMMUNITY): Payer: Self-pay

## 2015-11-03 ENCOUNTER — Ambulatory Visit (HOSPITAL_COMMUNITY)
Admission: RE | Admit: 2015-11-03 | Discharge: 2015-11-03 | Disposition: A | Discharge status: Home / Self Care | Payer: Medicare Other | Source: Ambulatory Visit | Attending: Orthopedic Surgery | Admitting: Orthopedic Surgery

## 2015-11-03 ENCOUNTER — Encounter (HOSPITAL_COMMUNITY)
Admission: RE | Admit: 2015-11-03 | Discharge: 2015-11-03 | Disposition: A | Discharge status: Home / Self Care | Payer: Medicare Other | Source: Ambulatory Visit | Attending: Orthopedic Surgery | Admitting: Orthopedic Surgery

## 2015-11-03 DIAGNOSIS — E119 Type 2 diabetes mellitus without complications: Secondary | ICD-10-CM | POA: Diagnosis not present

## 2015-11-03 DIAGNOSIS — K219 Gastro-esophageal reflux disease without esophagitis: Secondary | ICD-10-CM | POA: Insufficient documentation

## 2015-11-03 DIAGNOSIS — Z01818 Encounter for other preprocedural examination: Secondary | ICD-10-CM | POA: Insufficient documentation

## 2015-11-03 DIAGNOSIS — K760 Fatty (change of) liver, not elsewhere classified: Secondary | ICD-10-CM | POA: Diagnosis not present

## 2015-11-03 DIAGNOSIS — M170 Bilateral primary osteoarthritis of knee: Secondary | ICD-10-CM | POA: Diagnosis not present

## 2015-11-03 DIAGNOSIS — I10 Essential (primary) hypertension: Secondary | ICD-10-CM | POA: Insufficient documentation

## 2015-11-03 DIAGNOSIS — M1990 Unspecified osteoarthritis, unspecified site: Secondary | ICD-10-CM | POA: Insufficient documentation

## 2015-11-03 DIAGNOSIS — Z01812 Encounter for preprocedural laboratory examination: Secondary | ICD-10-CM | POA: Diagnosis not present

## 2015-11-03 DIAGNOSIS — M4690 Unspecified inflammatory spondylopathy, site unspecified: Secondary | ICD-10-CM | POA: Diagnosis not present

## 2015-11-03 DIAGNOSIS — E559 Vitamin D deficiency, unspecified: Secondary | ICD-10-CM | POA: Insufficient documentation

## 2015-11-03 DIAGNOSIS — D6489 Other specified anemias: Secondary | ICD-10-CM | POA: Insufficient documentation

## 2015-11-03 DIAGNOSIS — K648 Other hemorrhoids: Secondary | ICD-10-CM | POA: Diagnosis not present

## 2015-11-03 HISTORY — DX: Anemia, unspecified: D64.9

## 2015-11-03 LAB — CBC WITH DIFFERENTIAL/PLATELET
BASOS ABS: 0 10*3/uL (ref 0.0–0.1)
BASOS PCT: 0 %
Eosinophils Absolute: 0.1 10*3/uL (ref 0.0–0.7)
Eosinophils Relative: 1 %
HEMATOCRIT: 33.2 % — AB (ref 36.0–46.0)
HEMOGLOBIN: 10.5 g/dL — AB (ref 12.0–15.0)
LYMPHS PCT: 34 %
Lymphs Abs: 1.5 10*3/uL (ref 0.7–4.0)
MCH: 27 pg (ref 26.0–34.0)
MCHC: 31.6 g/dL (ref 30.0–36.0)
MCV: 85.3 fL (ref 78.0–100.0)
MONO ABS: 0.3 10*3/uL (ref 0.1–1.0)
MONOS PCT: 6 %
NEUTROS ABS: 2.6 10*3/uL (ref 1.7–7.7)
NEUTROS PCT: 59 %
Platelets: 336 10*3/uL (ref 150–400)
RBC: 3.89 MIL/uL (ref 3.87–5.11)
RDW: 13.6 % (ref 11.5–15.5)
WBC: 4.4 10*3/uL (ref 4.0–10.5)

## 2015-11-03 LAB — URINALYSIS, ROUTINE W REFLEX MICROSCOPIC
BILIRUBIN URINE: NEGATIVE
GLUCOSE, UA: NEGATIVE mg/dL
HGB URINE DIPSTICK: NEGATIVE
KETONES UR: NEGATIVE mg/dL
Nitrite: NEGATIVE
PH: 7 (ref 5.0–8.0)
Protein, ur: NEGATIVE mg/dL
SPECIFIC GRAVITY, URINE: 1.017 (ref 1.005–1.030)

## 2015-11-03 LAB — COMPREHENSIVE METABOLIC PANEL
ALBUMIN: 3.8 g/dL (ref 3.5–5.0)
ALK PHOS: 75 U/L (ref 38–126)
ALT: 12 U/L — ABNORMAL LOW (ref 14–54)
AST: 16 U/L (ref 15–41)
Anion gap: 10 (ref 5–15)
BILIRUBIN TOTAL: 0.3 mg/dL (ref 0.3–1.2)
BUN: 14 mg/dL (ref 6–20)
CALCIUM: 9.7 mg/dL (ref 8.9–10.3)
CO2: 29 mmol/L (ref 22–32)
CREATININE: 0.94 mg/dL (ref 0.44–1.00)
Chloride: 100 mmol/L — ABNORMAL LOW (ref 101–111)
GFR calc Af Amer: >60 mL/min (ref >60)
GFR, EST NON AFRICAN AMERICAN: 59 mL/min — AB (ref >60)
GLUCOSE: 122 mg/dL — AB (ref 65–99)
Potassium: 3.2 mmol/L — ABNORMAL LOW (ref 3.5–5.1)
Sodium: 139 mmol/L (ref 135–145)
TOTAL PROTEIN: 7.4 g/dL (ref 6.5–8.1)

## 2015-11-03 LAB — URINE MICROSCOPIC-ADD ON

## 2015-11-03 LAB — PROTIME-INR
INR: 1.03 (ref 0.00–1.49)
PROTHROMBIN TIME: 13.7 s (ref 11.6–15.2)

## 2015-11-03 LAB — SURGICAL PCR SCREEN
MRSA, PCR: NEGATIVE
Staphylococcus aureus: NEGATIVE

## 2015-11-03 LAB — GLUCOSE, CAPILLARY: Glucose-Capillary: 116 mg/dL — ABNORMAL HIGH (ref 65–99)

## 2015-11-03 LAB — APTT: APTT: 31 s (ref 24–37)

## 2015-11-03 NOTE — Pre-Procedure Instructions (Signed)
Loretta Barnett  11/03/2015      [id number] - [place], [place] - [address] AT [address] [place] [address] Phone: [phone] Fax: [phone]    Your procedure is scheduled on 11-13-2015   Monday    Report to [facility] Admitting at 9:00 A.M.   Call this number if you have problems the morning of surgery:  [phone]   Remember:  Do not eat food or drink liquids after midnight.   Take these medicines the morning of surgery with A SIP OF WATER Tylenol if needed,Cetirizine(Zyrtec)   How to Manage Your Diabetes Before Surgery   Why is it important to control my blood sugar before and after surgery?   Improving blood sugar levels before and after surgery helps healing and can limit problems.  A way of improving blood sugar control is eating a healthy diet by:  - Eating less sugar and carbohydrates  - Increasing activity/exercise  - Talk with your doctor about reaching your blood sugar goals  High blood sugars (greater than 180 mg/dL) can raise your risk of infections and slow down your recovery so you will need to focus on controlling your diabetes during the weeks before surgery.  Make sure that the doctor who takes care of your diabetes knows about your planned surgery including the date and location.  How do I manage my blood sugars before surgery?   Check your blood sugar at least 4 times a day, 2 days before surgery to make sure that they are not too high or low.   Check your blood sugar the morning of your surgery when you wake up and every 2 hours until you get to the Short-Stay unit.  If your blood sugar is less than 70 mg/dL, you will need to treat for low blood sugar by:  Treat a low blood sugar (less than 70 mg/dL) with 1/2 cup of clear juice (cranberry or apple), 4 glucose tablets, OR glucose gel.  Recheck blood sugar in 15 minutes after treatment (to make sure it is  greater than 70 mg/dL).  If blood sugar is not greater than 70 mg/dL on re-check, call [phone] for further instructions.   Report your blood sugar to the Short-Stay nurse when you get to Short-Stay.  References:  [facility], 2007 "How to Manage your Diabetes Before and After Surgery".  What do I do about my diabetes medications?   Do not take oral diabetes medicines (pills) the morning of surgery.              .     .                Do not wear jewelry, make-up or nail polish.  Do not wear lotions, powders, or perfumes.  You may not wear deodorant.  Do not shave 48 hours prior to surgery.     Do not bring valuables to the hospital.  [facility] is not responsible for any belongings or valuables.  Contacts, dentures or bridgework may not be worn into surgery.  Leave your suitcase in the car.  After surgery it may be brought to your room.  For patients admitted to the hospital, discharge time will be determined by your treatment team.    Special instructions:  See attached sheet for instructions on CHG shower  Please read over the following fact sheets  that you were given. Pain Booklet, Coughing and Deep Breathing and Surgical Site Infection Prevention

## 2015-11-03 NOTE — Progress Notes (Signed)
Pt. States that CHG soap causes a rash. Pt. Told not to use CHG but to take 2 showers with Dial soap.

## 2015-11-04 LAB — HEMOGLOBIN A1C
HEMOGLOBIN A1C: 7.6 % — AB (ref 4.8–5.6)
MEAN PLASMA GLUCOSE: 171 mg/dL

## 2015-11-04 LAB — URINE CULTURE: CULTURE: NO GROWTH

## 2015-11-10 MED ORDER — CEFAZOLIN SODIUM-DEXTROSE 2-3 GM-% IV SOLR
2.0000 g | INTRAVENOUS | Status: AC
  Administered 2015-11-13: 2 g via INTRAVENOUS
  Filled 2015-11-10: qty 50

## 2015-11-10 MED ORDER — TRANEXAMIC ACID 1000 MG/10ML IV SOLN
1000.0000 mg | INTRAVENOUS | Status: AC
  Administered 2015-11-13: 1000 mg via INTRAVENOUS
  Filled 2015-11-10: qty 10

## 2015-11-10 MED ORDER — SODIUM CHLORIDE 0.9 % IV SOLN: INTRAVENOUS | Status: DC

## 2015-11-13 ENCOUNTER — Inpatient Hospital Stay (HOSPITAL_COMMUNITY)
Admission: RE | Admit: 2015-11-13 | Discharge: 2015-11-14 | DRG: 470 | Disposition: A | Discharge status: Home Health | Payer: Medicare Other | Source: Ambulatory Visit | Attending: Orthopedic Surgery | Admitting: Orthopedic Surgery

## 2015-11-13 ENCOUNTER — Inpatient Hospital Stay (HOSPITAL_COMMUNITY): Payer: Medicare Other | Admitting: Certified Registered"

## 2015-11-13 ENCOUNTER — Encounter (HOSPITAL_COMMUNITY)
Admission: RE | Disposition: A | Discharge status: Home / Self Care | Payer: Self-pay | Source: Ambulatory Visit | Attending: Orthopedic Surgery

## 2015-11-13 DIAGNOSIS — Z96659 Presence of unspecified artificial knee joint: Secondary | ICD-10-CM

## 2015-11-13 DIAGNOSIS — Z888 Allergy status to other drugs, medicaments and biological substances status: Secondary | ICD-10-CM | POA: Diagnosis not present

## 2015-11-13 DIAGNOSIS — E119 Type 2 diabetes mellitus without complications: Secondary | ICD-10-CM | POA: Diagnosis present

## 2015-11-13 DIAGNOSIS — K219 Gastro-esophageal reflux disease without esophagitis: Secondary | ICD-10-CM | POA: Diagnosis present

## 2015-11-13 DIAGNOSIS — M1711 Unilateral primary osteoarthritis, right knee: Secondary | ICD-10-CM | POA: Diagnosis present

## 2015-11-13 DIAGNOSIS — Z79899 Other long term (current) drug therapy: Secondary | ICD-10-CM | POA: Diagnosis not present

## 2015-11-13 DIAGNOSIS — M170 Bilateral primary osteoarthritis of knee: Principal | ICD-10-CM | POA: Diagnosis present

## 2015-11-13 DIAGNOSIS — I10 Essential (primary) hypertension: Secondary | ICD-10-CM | POA: Diagnosis present

## 2015-11-13 DIAGNOSIS — Z885 Allergy status to narcotic agent status: Secondary | ICD-10-CM

## 2015-11-13 HISTORY — PX: TOTAL KNEE ARTHROPLASTY: SHX125

## 2015-11-13 LAB — GLUCOSE, CAPILLARY
GLUCOSE-CAPILLARY: 88 mg/dL (ref 65–99)
Glucose-Capillary: 130 mg/dL — ABNORMAL HIGH (ref 65–99)
Glucose-Capillary: 98 mg/dL (ref 65–99)
Glucose-Capillary: 199 mg/dL — ABNORMAL HIGH (ref 65–99)

## 2015-11-13 LAB — CBC
HCT: 31.1 % — ABNORMAL LOW (ref 36.0–46.0)
Hemoglobin: 9.7 g/dL — ABNORMAL LOW (ref 12.0–15.0)
MCH: 26.5 pg (ref 26.0–34.0)
MCHC: 31.2 g/dL (ref 30.0–36.0)
MCV: 85 fL (ref 78.0–100.0)
PLATELETS: 269 10*3/uL (ref 150–400)
RBC: 3.66 MIL/uL — ABNORMAL LOW (ref 3.87–5.11)
RDW: 13.3 % (ref 11.5–15.5)
WBC: 5.9 10*3/uL (ref 4.0–10.5)

## 2015-11-13 LAB — CREATININE, SERUM
CREATININE: 0.91 mg/dL (ref 0.44–1.00)
GFR calc Af Amer: >60 mL/min (ref >60)
GFR calc non Af Amer: >60 mL/min (ref >60)

## 2015-11-13 SURGERY — ARTHROPLASTY, KNEE, TOTAL
Anesthesia: Spinal | Site: Knee | Laterality: Right

## 2015-11-13 MED ORDER — LORATADINE 10 MG PO TABS
10.0000 mg | ORAL_TABLET | Freq: Every day | ORAL | Status: DC
  Administered 2015-11-14: 10 mg via ORAL
  Filled 2015-11-13: qty 1

## 2015-11-13 MED ORDER — MIDAZOLAM HCL 5 MG/5ML IJ SOLN
INTRAMUSCULAR | Status: DC | PRN
  Administered 2015-11-13: 1 mg via INTRAVENOUS
  Administered 2015-11-13 (×2): 0.5 mg via INTRAVENOUS

## 2015-11-13 MED ORDER — ONDANSETRON HCL 4 MG PO TABS: 4.0000 mg | ORAL_TABLET | Freq: Four times a day (QID) | ORAL | Status: DC | PRN

## 2015-11-13 MED ORDER — BUPIVACAINE LIPOSOME 1.3 % IJ SUSP
INTRAMUSCULAR | Status: DC | PRN
  Administered 2015-11-13: 20 mL

## 2015-11-13 MED ORDER — ROCURONIUM BROMIDE 50 MG/5ML IV SOLN
INTRAVENOUS | Status: AC
  Filled 2015-11-13: qty 2

## 2015-11-13 MED ORDER — TRANEXAMIC ACID 1000 MG/10ML IV SOLN
1000.0000 mg | Freq: Once | INTRAVENOUS | Status: DC
  Filled 2015-11-13: qty 10

## 2015-11-13 MED ORDER — SENNOSIDES-DOCUSATE SODIUM 8.6-50 MG PO TABS: 1.0000 | ORAL_TABLET | Freq: Every evening | ORAL | Status: DC | PRN

## 2015-11-13 MED ORDER — ONDANSETRON HCL 4 MG/2ML IJ SOLN
4.0000 mg | Freq: Four times a day (QID) | INTRAMUSCULAR | Status: DC | PRN
  Administered 2015-11-13: 4 mg via INTRAVENOUS
  Filled 2015-11-13: qty 2

## 2015-11-13 MED ORDER — CHLORHEXIDINE GLUCONATE 4 % EX LIQD: 60.0000 mL | Freq: Once | CUTANEOUS | Status: DC

## 2015-11-13 MED ORDER — SODIUM CHLORIDE 0.9 % IR SOLN
Status: DC | PRN
  Administered 2015-11-13: 3000 mL

## 2015-11-13 MED ORDER — MENTHOL 3 MG MT LOZG: 1.0000 | LOZENGE | OROMUCOSAL | Status: DC | PRN

## 2015-11-13 MED ORDER — METHOCARBAMOL 1000 MG/10ML IJ SOLN
500.0000 mg | Freq: Four times a day (QID) | INTRAVENOUS | Status: DC | PRN
  Filled 2015-11-13: qty 5

## 2015-11-13 MED ORDER — FENTANYL CITRATE (PF) 250 MCG/5ML IJ SOLN
INTRAMUSCULAR | Status: AC
  Filled 2015-11-13: qty 5

## 2015-11-13 MED ORDER — HYDROCHLOROTHIAZIDE 25 MG PO TABS
25.0000 mg | ORAL_TABLET | Freq: Every day | ORAL | Status: DC
  Administered 2015-11-13 – 2015-11-14 (×2): 25 mg via ORAL
  Filled 2015-11-13 (×2): qty 1

## 2015-11-13 MED ORDER — 0.9 % SODIUM CHLORIDE (POUR BTL) OPTIME
TOPICAL | Status: DC | PRN
Start: 2015-11-13 — End: 2015-11-13
  Administered 2015-11-13: 1000 mL

## 2015-11-13 MED ORDER — SODIUM CHLORIDE 0.9 % IV SOLN: INTRAVENOUS | Status: DC

## 2015-11-13 MED ORDER — ALUM & MAG HYDROXIDE-SIMETH 200-200-20 MG/5ML PO SUSP: 30.0000 mL | ORAL | Status: DC | PRN

## 2015-11-13 MED ORDER — POTASSIUM CHLORIDE CRYS ER 20 MEQ PO TBCR
20.0000 meq | EXTENDED_RELEASE_TABLET | Freq: Every day | ORAL | Status: DC
  Administered 2015-11-13 – 2015-11-14 (×2): 20 meq via ORAL
  Filled 2015-11-13 (×2): qty 1

## 2015-11-13 MED ORDER — ACETAMINOPHEN 650 MG RE SUPP: 650.0000 mg | Freq: Four times a day (QID) | RECTAL | Status: DC | PRN

## 2015-11-13 MED ORDER — SODIUM CHLORIDE 0.9 % IJ SOLN
INTRAMUSCULAR | Status: AC
  Filled 2015-11-13: qty 10

## 2015-11-13 MED ORDER — LIDOCAINE HCL (CARDIAC) 20 MG/ML IV SOLN
INTRAVENOUS | Status: AC
  Filled 2015-11-13: qty 5

## 2015-11-13 MED ORDER — ZOLPIDEM TARTRATE 5 MG PO TABS: 5.0000 mg | ORAL_TABLET | Freq: Every evening | ORAL | Status: DC | PRN

## 2015-11-13 MED ORDER — DIPHENHYDRAMINE HCL 12.5 MG/5ML PO ELIX: 12.5000 mg | ORAL_SOLUTION | ORAL | Status: DC | PRN

## 2015-11-13 MED ORDER — EPHEDRINE SULFATE 50 MG/ML IJ SOLN
INTRAMUSCULAR | Status: AC
  Filled 2015-11-13: qty 1

## 2015-11-13 MED ORDER — BUPIVACAINE-EPINEPHRINE (PF) 0.25% -1:200000 IJ SOLN
INTRAMUSCULAR | Status: DC | PRN
  Administered 2015-11-13: 30 mL

## 2015-11-13 MED ORDER — HYDROMORPHONE HCL 1 MG/ML IJ SOLN: 0.2500 mg | INTRAMUSCULAR | Status: DC | PRN

## 2015-11-13 MED ORDER — ENOXAPARIN SODIUM 30 MG/0.3ML ~~LOC~~ SOLN
30.0000 mg | Freq: Two times a day (BID) | SUBCUTANEOUS | Status: DC
  Administered 2015-11-14: 30 mg via SUBCUTANEOUS
  Filled 2015-11-13: qty 0.3

## 2015-11-13 MED ORDER — LACTATED RINGERS IV SOLN
INTRAVENOUS | Status: DC | PRN
  Administered 2015-11-13 (×2): via INTRAVENOUS

## 2015-11-13 MED ORDER — INSULIN ASPART 100 UNIT/ML ~~LOC~~ SOLN: 0.0000 [IU] | Freq: Every day | SUBCUTANEOUS | Status: DC

## 2015-11-13 MED ORDER — METOCLOPRAMIDE HCL 5 MG/ML IJ SOLN: 5.0000 mg | Freq: Three times a day (TID) | INTRAMUSCULAR | Status: DC | PRN

## 2015-11-13 MED ORDER — CEFAZOLIN SODIUM 1-5 GM-% IV SOLN
1.0000 g | Freq: Four times a day (QID) | INTRAVENOUS | Status: AC
  Administered 2015-11-13 (×2): 1 g via INTRAVENOUS
  Filled 2015-11-13 (×2): qty 50

## 2015-11-13 MED ORDER — METHOCARBAMOL 500 MG PO TABS
500.0000 mg | ORAL_TABLET | Freq: Four times a day (QID) | ORAL | Status: DC | PRN
  Administered 2015-11-13 – 2015-11-14 (×3): 500 mg via ORAL
  Filled 2015-11-13 (×4): qty 1

## 2015-11-13 MED ORDER — INSULIN ASPART 100 UNIT/ML ~~LOC~~ SOLN
0.0000 [IU] | Freq: Three times a day (TID) | SUBCUTANEOUS | Status: DC
  Administered 2015-11-14: 3 [IU] via SUBCUTANEOUS
  Administered 2015-11-14: 2 [IU] via SUBCUTANEOUS
  Administered 2015-11-14: 3 [IU] via SUBCUTANEOUS

## 2015-11-13 MED ORDER — PROPOFOL 500 MG/50ML IV EMUL
INTRAVENOUS | Status: DC | PRN
  Administered 2015-11-13: 25 ug/kg/min via INTRAVENOUS

## 2015-11-13 MED ORDER — PHENOL 1.4 % MT LIQD: 1.0000 | OROMUCOSAL | Status: DC | PRN

## 2015-11-13 MED ORDER — SODIUM CHLORIDE 0.9 % IJ SOLN
INTRAMUSCULAR | Status: DC | PRN
Start: 2015-11-13 — End: 2015-11-13
  Administered 2015-11-13: 20 mL via INTRAVENOUS

## 2015-11-13 MED ORDER — BISACODYL 5 MG PO TBEC: 5.0000 mg | DELAYED_RELEASE_TABLET | Freq: Every day | ORAL | Status: DC | PRN

## 2015-11-13 MED ORDER — LOSARTAN POTASSIUM 50 MG PO TABS
100.0000 mg | ORAL_TABLET | Freq: Every day | ORAL | Status: DC
  Administered 2015-11-13 – 2015-11-14 (×2): 100 mg via ORAL
  Filled 2015-11-13 (×3): qty 2

## 2015-11-13 MED ORDER — FUROSEMIDE 20 MG PO TABS
20.0000 mg | ORAL_TABLET | Freq: Every day | ORAL | Status: DC
  Administered 2015-11-13 – 2015-11-14 (×2): 20 mg via ORAL
  Filled 2015-11-13: qty 1

## 2015-11-13 MED ORDER — METFORMIN HCL 500 MG PO TABS
500.0000 mg | ORAL_TABLET | Freq: Two times a day (BID) | ORAL | Status: DC
  Administered 2015-11-13 – 2015-11-14 (×3): 500 mg via ORAL
  Filled 2015-11-13 (×3): qty 1

## 2015-11-13 MED ORDER — DOCUSATE SODIUM 100 MG PO CAPS
100.0000 mg | ORAL_CAPSULE | Freq: Two times a day (BID) | ORAL | Status: DC
  Administered 2015-11-13 – 2015-11-14 (×2): 100 mg via ORAL
  Filled 2015-11-13 (×2): qty 1

## 2015-11-13 MED ORDER — HYDROMORPHONE HCL 1 MG/ML IJ SOLN: 1.0000 mg | INTRAMUSCULAR | Status: DC | PRN

## 2015-11-13 MED ORDER — ACETAMINOPHEN 325 MG PO TABS: 650.0000 mg | ORAL_TABLET | Freq: Four times a day (QID) | ORAL | Status: DC | PRN

## 2015-11-13 MED ORDER — CELECOXIB 200 MG PO CAPS
200.0000 mg | ORAL_CAPSULE | Freq: Two times a day (BID) | ORAL | Status: DC
  Administered 2015-11-13: 200 mg via ORAL
  Filled 2015-11-13: qty 1

## 2015-11-13 MED ORDER — PROMETHAZINE HCL 25 MG/ML IJ SOLN: 6.2500 mg | INTRAMUSCULAR | Status: DC | PRN

## 2015-11-13 MED ORDER — FLEET ENEMA 7-19 GM/118ML RE ENEM: 1.0000 | ENEMA | Freq: Once | RECTAL | Status: DC | PRN

## 2015-11-13 MED ORDER — FENTANYL CITRATE (PF) 250 MCG/5ML IJ SOLN
INTRAMUSCULAR | Status: DC | PRN
  Administered 2015-11-13 (×2): 25 ug via INTRAVENOUS
  Administered 2015-11-13: 50 ug via INTRAVENOUS

## 2015-11-13 MED ORDER — METOCLOPRAMIDE HCL 5 MG PO TABS: 5.0000 mg | ORAL_TABLET | Freq: Three times a day (TID) | ORAL | Status: DC | PRN

## 2015-11-13 MED ORDER — MIDAZOLAM HCL 2 MG/2ML IJ SOLN
INTRAMUSCULAR | Status: AC
  Filled 2015-11-13: qty 2

## 2015-11-13 MED ORDER — BUPIVACAINE-EPINEPHRINE (PF) 0.25% -1:200000 IJ SOLN
INTRAMUSCULAR | Status: AC
  Filled 2015-11-13: qty 30

## 2015-11-13 MED ORDER — LOSARTAN POTASSIUM-HCTZ 100-25 MG PO TABS: 1.0000 | ORAL_TABLET | Freq: Every day | ORAL | Status: DC

## 2015-11-13 MED ORDER — TRAMADOL HCL 50 MG PO TABS
50.0000 mg | ORAL_TABLET | ORAL | Status: DC | PRN
  Administered 2015-11-14: 100 mg via ORAL
  Filled 2015-11-13 (×2): qty 2

## 2015-11-13 SURGICAL SUPPLY — 57 items
BANDAGE ESMARK 6X9 LF (GAUZE/BANDAGES/DRESSINGS) ×1 IMPLANT
BLADE SAGITTAL 13X1.27X60 (BLADE) ×2 IMPLANT
BLADE SAW SGTL 83.5X18.5 (BLADE) ×2 IMPLANT
BLADE SURG 10 STRL SS (BLADE) ×2 IMPLANT
BNDG CMPR 9X6 STRL LF SNTH (GAUZE/BANDAGES/DRESSINGS) ×1
BNDG ESMARK 6X9 LF (GAUZE/BANDAGES/DRESSINGS) ×2
BOWL SMART MIX CTS (DISPOSABLE) ×2 IMPLANT
CAPT KNEE TOTAL 3 ×2 IMPLANT
CEMENT BONE SIMPLEX SPEEDSET (Cement) ×4 IMPLANT
COVER SURGICAL LIGHT HANDLE (MISCELLANEOUS) ×2 IMPLANT
CUFF TOURNIQUET SINGLE 34IN LL (TOURNIQUET CUFF) ×2 IMPLANT
DRAPE EXTREMITY T 121X128X90 (DRAPE) ×2 IMPLANT
DRAPE INCISE IOBAN 66X45 STRL (DRAPES) ×4 IMPLANT
DRAPE PROXIMA HALF (DRAPES) IMPLANT
DRAPE U-SHAPE 47X51 STRL (DRAPES) ×2 IMPLANT
DRSG ADAPTIC 3X8 NADH LF (GAUZE/BANDAGES/DRESSINGS) ×2 IMPLANT
DRSG PAD ABDOMINAL 8X10 ST (GAUZE/BANDAGES/DRESSINGS) ×2 IMPLANT
DURAPREP 26ML APPLICATOR (WOUND CARE) ×4 IMPLANT
ELECT REM PT RETURN 9FT ADLT (ELECTROSURGICAL) ×2
ELECTRODE REM PT RTRN 9FT ADLT (ELECTROSURGICAL) ×1 IMPLANT
GAUZE SPONGE 4X4 12PLY STRL (GAUZE/BANDAGES/DRESSINGS) ×2 IMPLANT
GLOVE BIOGEL M 7.0 STRL (GLOVE) IMPLANT
GLOVE BIOGEL PI IND STRL 7.5 (GLOVE) IMPLANT
GLOVE BIOGEL PI IND STRL 8.5 (GLOVE) ×2 IMPLANT
GLOVE BIOGEL PI INDICATOR 7.5 (GLOVE)
GLOVE BIOGEL PI INDICATOR 8.5 (GLOVE) ×2
GLOVE SURG ORTHO 8.0 STRL STRW (GLOVE) ×4 IMPLANT
GOWN STRL REUS W/ TWL LRG LVL3 (GOWN DISPOSABLE) ×1 IMPLANT
GOWN STRL REUS W/ TWL XL LVL3 (GOWN DISPOSABLE) ×2 IMPLANT
GOWN STRL REUS W/TWL LRG LVL3 (GOWN DISPOSABLE) ×2
GOWN STRL REUS W/TWL XL LVL3 (GOWN DISPOSABLE) ×4
HANDPIECE INTERPULSE COAX TIP (DISPOSABLE) ×2
HOOD PEEL AWAY FACE SHEILD DIS (HOOD) ×6 IMPLANT
KIT BASIN OR (CUSTOM PROCEDURE TRAY) ×2 IMPLANT
KIT ROOM TURNOVER OR (KITS) ×2 IMPLANT
KNEE CAPITATED TOTAL 3 IMPLANT
MANIFOLD NEPTUNE II (INSTRUMENTS) ×2 IMPLANT
NEEDLE 22X1 1/2 (OR ONLY) (NEEDLE) ×4 IMPLANT
NS IRRIG 1000ML POUR BTL (IV SOLUTION) ×2 IMPLANT
PACK TOTAL JOINT (CUSTOM PROCEDURE TRAY) ×2 IMPLANT
PACK UNIVERSAL I (CUSTOM PROCEDURE TRAY) ×2 IMPLANT
PAD ARMBOARD 7.5X6 YLW CONV (MISCELLANEOUS) ×4 IMPLANT
PADDING CAST COTTON 6X4 STRL (CAST SUPPLIES) ×2 IMPLANT
SET HNDPC FAN SPRY TIP SCT (DISPOSABLE) ×1 IMPLANT
SPONGE GAUZE 4X4 12PLY STER LF (GAUZE/BANDAGES/DRESSINGS) ×1 IMPLANT
STAPLER VISISTAT 35W (STAPLE) ×2 IMPLANT
SUCTION FRAZIER TIP 10 FR DISP (SUCTIONS) ×2 IMPLANT
SUT BONE WAX W31G (SUTURE) ×2 IMPLANT
SUT VIC AB 0 CTB1 27 (SUTURE) ×4 IMPLANT
SUT VIC AB 1 CT1 27 (SUTURE) ×4
SUT VIC AB 1 CT1 27XBRD ANBCTR (SUTURE) ×2 IMPLANT
SUT VIC AB 2-0 CT1 27 (SUTURE) ×4
SUT VIC AB 2-0 CT1 TAPERPNT 27 (SUTURE) ×2 IMPLANT
SYR 20CC LL (SYRINGE) ×4 IMPLANT
TOWEL OR 17X24 6PK STRL BLUE (TOWEL DISPOSABLE) ×2 IMPLANT
TOWEL OR 17X26 10 PK STRL BLUE (TOWEL DISPOSABLE) ×2 IMPLANT
WATER STERILE IRR 1000ML POUR (IV SOLUTION) ×4 IMPLANT

## 2015-11-13 NOTE — Progress Notes (Signed)
Orthopedic Tech Progress Note Patient Details:  Loretta Barnett April 03, 1941 [id number]  CPM Left Knee CPM Left Knee: On Left Knee Flexion (Degrees): 90 Left Knee Extension (Degrees): 0 Additional Comments: Trapeze bar and full roll CPM Right Knee CPM Right Knee: On   [name] 11/13/2015, 2:23 PM

## 2015-11-13 NOTE — Progress Notes (Signed)
Utilization review completed.  

## 2015-11-13 NOTE — Anesthesia Preprocedure Evaluation (Signed)
Anesthesia Evaluation  Patient identified by MRN, date of birth, ID band Patient awake    Reviewed: Allergy & Precautions, NPO status , Patient's Chart, lab work & pertinent test results  Airway Mallampati: II  TM Distance: >3 FB Neck ROM: Full    Dental no notable dental hx.    Pulmonary neg pulmonary ROS,    Pulmonary exam normal breath sounds clear to auscultation       Cardiovascular hypertension, Pt. on medications Normal cardiovascular exam Rhythm:Regular Rate:Normal     Neuro/Psych negative neurological ROS  negative psych ROS   GI/Hepatic negative GI ROS, Neg liver ROS,   Endo/Other  diabetes  Renal/GU negative Renal ROS  negative genitourinary   Musculoskeletal negative musculoskeletal ROS (+)   Abdominal   Peds negative pediatric ROS (+)  Hematology negative hematology ROS (+)   Anesthesia Other Findings   Reproductive/Obstetrics negative OB ROS                             Anesthesia Physical Anesthesia Plan  ASA: III  Anesthesia Plan: Spinal   Post-op Pain Management:    Induction: Intravenous  Airway Management Planned: Simple Face Mask  Additional Equipment:   Intra-op Plan:   Post-operative Plan:   Informed Consent: I have reviewed the patients History and Physical, chart, labs and discussed the procedure including the risks, benefits and alternatives for the proposed anesthesia with the patient or authorized representative who has indicated his/her understanding and acceptance.   Dental advisory given  Plan Discussed with: CRNA and Surgeon  Anesthesia Plan Comments:         Anesthesia Quick Evaluation
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
## 2015-11-13 NOTE — Transfer of Care (Signed)
Immediate Anesthesia Transfer of Care Note  Patient: Loretta Barnett  Procedure(s) Performed: Procedure(s): TOTAL KNEE ARTHROPLASTY (Right)  Patient Location: PACU  Anesthesia Type:Spinal  Level of Consciousness: awake, alert  and oriented  Airway & Oxygen Therapy: Patient Spontanous Breathing  Post-op Assessment: Report given to RN, Post -op Vital signs reviewed and stable and Patient moving all extremities X 4  Post vital signs: Reviewed and stable  Last Vitals:  Filed Vitals:   11/13/15 0929  BP: 148/54  Pulse: 78  Temp: 36.1 C  Resp: 20    Complications: No apparent anesthesia complications

## 2015-11-14 ENCOUNTER — Encounter (HOSPITAL_COMMUNITY): Payer: Self-pay | Admitting: General Practice

## 2015-11-14 LAB — CBC
HEMATOCRIT: 26.5 % — AB (ref 36.0–46.0)
HEMOGLOBIN: 8.2 g/dL — AB (ref 12.0–15.0)
MCH: 26.1 pg (ref 26.0–34.0)
MCHC: 30.9 g/dL (ref 30.0–36.0)
MCV: 84.4 fL (ref 78.0–100.0)
Platelets: 270 10*3/uL (ref 150–400)
RBC: 3.14 MIL/uL — AB (ref 3.87–5.11)
RDW: 13.7 % (ref 11.5–15.5)
WBC: 6.5 10*3/uL (ref 4.0–10.5)

## 2015-11-14 LAB — BASIC METABOLIC PANEL
Anion gap: 9 (ref 5–15)
BUN: 15 mg/dL (ref 6–20)
CALCIUM: 8.6 mg/dL — AB (ref 8.9–10.3)
CHLORIDE: 102 mmol/L (ref 101–111)
CO2: 28 mmol/L (ref 22–32)
CREATININE: 1.05 mg/dL — AB (ref 0.44–1.00)
GFR, EST AFRICAN AMERICAN: 60 mL/min — AB (ref >60)
GFR, EST NON AFRICAN AMERICAN: 52 mL/min — AB (ref >60)
Glucose, Bld: 162 mg/dL — ABNORMAL HIGH (ref 65–99)
Potassium: 3.2 mmol/L — ABNORMAL LOW (ref 3.5–5.1)
SODIUM: 139 mmol/L (ref 135–145)

## 2015-11-14 LAB — GLUCOSE, CAPILLARY
GLUCOSE-CAPILLARY: 180 mg/dL — AB (ref 65–99)
GLUCOSE-CAPILLARY: 154 mg/dL — AB (ref 65–99)
GLUCOSE-CAPILLARY: 143 mg/dL — AB (ref 65–99)

## 2015-11-14 MED ORDER — METHOCARBAMOL 500 MG PO TABS: 500.0000 mg | ORAL_TABLET | Freq: Four times a day (QID) | ORAL | Status: DC | PRN

## 2015-11-14 MED ORDER — ALUM & MAG HYDROXIDE-SIMETH 200-200-20 MG/5ML PO SUSP: 30.0000 mL | ORAL | Status: DC | PRN

## 2015-11-14 MED ORDER — ENOXAPARIN SODIUM 40 MG/0.4ML ~~LOC~~ SOLN: 40.0000 mg | SUBCUTANEOUS | Status: DC

## 2015-11-14 MED ORDER — TRAMADOL HCL 50 MG PO TABS: 50.0000 mg | ORAL_TABLET | ORAL | Status: DC | PRN

## 2015-11-14 NOTE — Progress Notes (Signed)
SPORTS MEDICINE AND JOINT REPLACEMENT  Lara Mulch, MD   Carlynn Spry, PA-C [place], [place], [place]  [address]                             [phone]   PROGRESS NOTE  Subjective:  negative for Chest Pain  negative for Shortness of Breath  negative for Nausea/Vomiting   negative for Calf Pain  negative for Bowel Movement   Tolerating Diet: yes         Patient reports pain as 3 on 0-10 scale.    Objective: Vital signs in last 24 hours:   Patient Vitals for the past 24 hrs:  BP Temp Temp src Pulse Resp SpO2 Height Weight  11/14/15 0651 (!) 178/78 mmHg 97.8 F (36.6 C) Oral 89 17 100 % - -  11/13/15 2300 (!) 162/58 mmHg - - 88 - - - -  11/13/15 2140 (!) 170/77 mmHg 97.9 F (36.6 C) Oral 98 18 100 % - -  11/13/15 1559 (!) 159/76 mmHg 97.2 F (36.2 C) Oral 82 18 100 % - -  11/13/15 1534 - 97.1 F (36.2 C) - - - - - -  11/13/15 1530 (!) 145/75 mmHg - - 78 16 97 % - -  11/13/15 1515 (!) 155/84 mmHg - - 72 12 94 % - -  11/13/15 1500 (!) 83/72 mmHg - - 77 12 95 % - -  11/13/15 1445 (!) 143/97 mmHg - - 71 14 96 % - -  11/13/15 1431 133/70 mmHg - - 79 14 94 % - -  11/13/15 1414 140/67 mmHg - - 78 13 94 % - -  11/13/15 1359 136/67 mmHg - - 83 12 95 % - -  11/13/15 1345 - 98.4 F (36.9 C) - - - - - -  11/13/15 1344 139/68 mmHg - - 79 13 98 % - -  11/13/15 0929 (!) 148/54 mmHg 97 F (36.1 C) Oral 78 20 100 % 5' 4.5" (1.638 m) 78.109 kg (172 lb 3.2 oz)    @flow {1959:LAST@   Intake/Output from previous day:   11/28 0701 - 11/29 0700 In: [id number] [P.O.:220; I.V.:1275] Out: 1000 [Urine:1000]   Intake/Output this shift:       Intake/Output      11/28 0701 - 11/29 0700 11/29 0701 - 11/30 0700   P.O. 220    I.V. (mL/kg) 1275 (16.3)    IV Piggyback 50    Total Intake(mL/kg) 1545 (19.8)    Urine (mL/kg/hr) 1000    Total Output 1000     Net +545          Urine Occurrence 3 x       LABORATORY DATA:  Recent Labs  11/13/15 1653 11/14/15 0426  WBC 5.9 6.5   HGB 9.7* 8.2*  HCT 31.1* 26.5*  PLT 269 270    Recent Labs  11/13/15 1653 11/14/15 0426  NA  --  139  K  --  3.2*  CL  --  102  CO2  --  28  BUN  --  15  CREATININE 0.91 1.05*  GLUCOSE  --  162*  CALCIUM  --  8.6*   Lab Results  Component Value Date   INR 1.03 11/03/2015   INR 1.01 03/24/2015    Examination:  General appearance: alert, cooperative and no distress Extremities: Homans sign is negative, no sign of DVT  Wound Exam: clean, dry, intact  Drainage:  None: wound tissue dry  Motor Exam: Quadriceps and Hamstrings Intact  Sensory Exam: Deep Peroneal normal   Assessment:    1 Day Post-Op  Procedure(s) (LRB): TOTAL KNEE ARTHROPLASTY (Right)  ADDITIONAL DIAGNOSIS:  Active Problems:   S/P total knee arthroplasty  Acute Blood Loss Anemia   Plan: Physical Therapy as ordered Weight Bearing as Tolerated (WBAT)  DVT Prophylaxis:  Lovenox  DISCHARGE PLAN: Home  DISCHARGE NEEDS: HHPT, CPM, Walker and 3-in-1 comode seat         Jaleesa Cervi 11/14/2015, 7:35 AM

## 2015-11-14 NOTE — Care Management Note (Signed)
Case Management Note  Patient Details  Name: Loretta Barnett MRN: [id number] Date of Birth: 02-07-1941  Subjective/Objective:        S/p right total knee arthroplasty            Action/Plan: Set up with [facility] for HHPT by MD office. Spoke with patient, no change in discharge plan. CPM has been delivered to home by [employer], patient already had rolling walker and 3N1. Patient stated that family will be available to assist her after discharge.  Expected Discharge Date:                  Expected Discharge Plan:  [facility]  In-House Referral:  NA  Discharge planning Services  CM Consult  Post Acute Care Choice:  Durable Medical Equipment, Home Health Choice offered to:  Patient  DME Arranged:  CPM DME Agency:  [employer]  HH Arranged:  PT [place] Agency:  [facility]  Status of Service:  Completed, signed off  Medicare Important Message Given:    Date Medicare IM Given:    Medicare IM give by:    Date Additional Medicare IM Given:    Additional Medicare Important Message give by:     If discussed at [place] of Stay Meetings, dates discussed:    Additional Comments:  Nila Nephew, RN 11/14/2015, 11:01 AM

## 2015-11-14 NOTE — Anesthesia Postprocedure Evaluation (Signed)
Anesthesia Post Note  Patient: Loretta Barnett  Procedure(s) Performed: Procedure(s) (LRB): TOTAL KNEE ARTHROPLASTY (Right)  Patient location during evaluation: PACU Anesthesia Type: Spinal Level of consciousness: awake and alert Pain management: pain level controlled Vital Signs Assessment: post-procedure vital signs reviewed and stable Respiratory status: spontaneous breathing, nonlabored ventilation, respiratory function stable and patient connected to nasal cannula oxygen Cardiovascular status: blood pressure returned to baseline and stable Postop Assessment: no signs of nausea or vomiting Anesthetic complications: no    Last Vitals:  Filed Vitals:   11/13/15 2300 11/14/15 0651  BP: 162/58 178/78  Pulse: 88 89  Temp:  36.6 C  Resp:  17    Last Pain:  Filed Vitals:   11/14/15 0651  PainSc: 5     LLE Motor Response: Purposeful movement   RLE Motor Response: Purposeful movement   L Sensory Level: S1-Sole of foot, small toes R Sensory Level: S1-Sole of foot, small toes  Candus Braud S

## 2015-11-14 NOTE — Evaluation (Signed)
Occupational Therapy Evaluation Patient Details Name: Loretta Barnett MRN: [id number] DOB: 11-06-1941 Today's Date: 11/14/2015    History of Present Illness 74 y.o. female admitted to [facility] on 11/13/15 for elective R TKA.  Pt with significant PMHx of DM, HTN, obesity, anemia, multiple right and left leg surgeries.     Clinical Impression   Pt reports she was independent with ADLs PTA. Currently pt is overall min guard for ADLs and functional mobility with the exception of min assist for LB ADLs. All education complete; pt and family with no further questions or concerns for OT at this time. Pt planning to d/c home with 24/7 supervision from family. Pt ready to d/c from an OT standpoint; signing off at this time. Thank you for this referral.     Follow Up Recommendations  No OT follow up;Supervision - Intermittent    Equipment Recommendations  None recommended by OT    Recommendations for Other Services       Precautions / Restrictions Precautions Precautions: Knee Restrictions Weight Bearing Restrictions: Yes RLE Weight Bearing: Weight bearing as tolerated      Mobility Bed Mobility               General bed mobility comments: Pt OOB in chair  Transfers Overall transfer level: Needs assistance Equipment used: Rolling walker (2 wheeled) Transfers: Sit to/from Stand Sit to Stand: Min guard         General transfer comment: Min guard for safety. Good hand placement and technique. Sit to stand from chair x 1    Balance Overall balance assessment: Needs assistance Sitting-balance support: Feet supported Sitting balance-Leahy Scale: Good     Standing balance support: Bilateral upper extremity supported Standing balance-Leahy Scale: Fair Standing balance comment: RW for support                             ADL Overall ADL's : Needs assistance/impaired Eating/Feeding: Set up;Sitting   Grooming: Min guard;Standing       Lower Body Bathing:  Minimal assistance;Sit to/from stand       Lower Body Dressing: Minimal assistance;Sit to/from stand Lower Body Dressing Details (indicate cue type and reason): Educated pt and family on compensatory strategies for LB ADLs; pt verbalized understanding. Toilet Transfer: Min guard;Ambulation;BSC;RW (BSC over toilet)         Tub/Shower Transfer Details (indicate cue type and reason): Educated on tub transfer technique. Suggested that pt perform dry run through with home health therapist prior to attempting on her own; pt verbalized understanding. Functional mobility during ADLs: Min guard;Rolling walker General ADL Comments: Family present for OT eval. Educated pt and family on need for close supervision during ADLs and mobility, edema management, and home safety; pt and family verbalize understanding.      Vision     Perception     Praxis      Pertinent Vitals/Pain Pain Assessment: Faces Faces Pain Scale: Hurts little more Pain Location: R knee Pain Descriptors / Indicators: Sore Pain Intervention(s): Limited activity within patient's tolerance;Monitored during session;Repositioned;Ice applied     Hand Dominance Left   Extremity/Trunk Assessment Upper Extremity Assessment Upper Extremity Assessment: Overall WFL for tasks assessed   Lower Extremity Assessment Lower Extremity Assessment: Defer to PT evaluation   Cervical / Trunk Assessment Cervical / Trunk Assessment: Normal   Communication Communication Communication: No difficulties   Cognition Arousal/Alertness: Awake/alert Behavior During Therapy: WFL for tasks assessed/performed Overall Cognitive Status: Within  Functional Limits for tasks assessed                     General Comments       Exercises       Shoulder Instructions      Home Living Family/patient expects to be discharged to:: Private residence Living Arrangements: Spouse/significant other Available Help at Discharge: Family;Available  24 hours/day;Friend(s) Type of Home: House Home Access: Stairs to enter [employer] of Steps: 2 Entrance Stairs-Rails: Left Home Layout: Bed/bath upstairs;Two level (has a stair lift to get upstairs ) Alternate Level Stairs-Number of Steps: 7 Alternate Level Stairs-Rails: Right Bathroom Shower/Tub: Tub/shower unit;Curtain Shower/tub characteristics: Architectural technologist: Handicapped height Bathroom Accessibility: Yes How Accessible: Accessible via walker Home Equipment: [place] - 2 wheels;Cane - single point;Shower seat;Bedside commode          Prior Functioning/Environment Level of Independence: Independent             OT Diagnosis: Acute pain   OT Problem List:     OT Treatment/Interventions:      OT Goals(Current goals can be found in the care plan section) Acute Rehab OT Goals Patient Stated Goal: to go home this afternoon OT Goal Formulation: With patient  OT Frequency:     Barriers to D/C:            Co-evaluation              End of Session Equipment Utilized During Treatment: Gait belt;Rolling walker CPM Right Knee CPM Right Knee: Off  Activity Tolerance: Patient tolerated treatment well Patient left: in chair;with call bell/phone within reach;with family/visitor present   Time: [id number] OT Time Calculation (min): 13 min Charges:  OT General Charges $OT Visit: 1 Procedure OT Evaluation $Initial OT Evaluation Tier I: 1 Procedure G-Codes:     Binnie Kand M.S., OTR/L Pager: [phone]  11/14/2015, 2:30 PM

## 2015-11-14 NOTE — Evaluation (Signed)
Physical Therapy Evaluation Patient Details Name: Loretta Barnett MRN: [id number] DOB: 05-09-1941 Today's Date: 11/14/2015   History of Present Illness  74 y.o. female admitted to [facility] on 11/13/15 for elective R TKA.  Pt with significant PMHx of DM, HTN, obesity, anemia, multiple right and left leg surgeries.    Clinical Impression  Pt is POD #1 and is moving very well with good speed and min guard assistance into the hallway.  She would like to d/c home after PM PT session and I think, physically, this would be possible.   PT to follow acutely for deficits listed below.       Follow Up Recommendations Home health PT;Supervision for mobility/OOB    Equipment Recommendations  None recommended by PT    Recommendations for Other Services   NA    Precautions / Restrictions Precautions Precautions: Knee Precaution Booklet Issued: Yes (comment) Precaution Comments: knee exercise handout given and precautions reviewed Restrictions Weight Bearing Restrictions: Yes RLE Weight Bearing: Weight bearing as tolerated      Mobility  Bed Mobility Overal bed mobility: Needs Assistance Bed Mobility: Supine to Sit     Supine to sit: Min guard     General bed mobility comments: Min guard assist to support right leg during transitions.   Transfers Overall transfer level: Needs assistance Equipment used: Rolling walker (2 wheeled) Transfers: Sit to/from Stand Sit to Stand: Min guard         General transfer comment: min guard assist for safety due to first time up  Ambulation/Gait Ambulation/Gait assistance: Min guard Ambulation Distance (Feet): 150 Feet Assistive device: Rolling walker (2 wheeled) Gait Pattern/deviations: Step-through pattern;Antalgic (toe down gait pattern at first) Gait velocity: moves quickly Gait velocity interpretation: at or above normal speed for age/gender General Gait Details: Verbal cues for WBAT status and good heel contact during  first foot contact  with the floor.  Pt needs verbal cues for upright posture.          Balance Overall balance assessment: Needs assistance Sitting-balance support: Feet supported;No upper extremity supported Sitting balance-Leahy Scale: Good     Standing balance support: Bilateral upper extremity supported;No upper extremity supported Standing balance-Leahy Scale: Fair                               Pertinent Vitals/Pain Pain Assessment: 0-10 Pain Score: 4  Pain Location: right knee Pain Descriptors / Indicators: Aching;Burning Pain Intervention(s): Limited activity within patient's tolerance;Monitored during session;Repositioned;RN gave pain meds during session    [place] expects to be discharged to:: Private residence Living Arrangements: Spouse/significant other Available Help at Discharge: Family;Available 24 hours/day;Friend(s) Type of Home: House Home Access: Stairs to enter Entrance Stairs-Rails: Left Entrance Stairs-Number of Steps: 2 Home Layout: Bed/bath upstairs;Two level (has a stair lift to get upstairs) Home Equipment: Walker - 2 wheels;Cane - single point      Prior Function Level of Independence: Independent               Hand Dominance   Dominant Hand: Left    Extremity/Trunk Assessment   Upper Extremity Assessment: Defer to OT evaluation           Lower Extremity Assessment: RLE deficits/detail RLE Deficits / Details: right leg with normal post op pain and weakness, ankle at least 3/5, knee 2/5, hip 2/5    Cervical / Trunk Assessment: Normal  Communication   Communication: No difficulties  Cognition  Arousal/Alertness: Awake/alert Behavior During Therapy: WFL for tasks assessed/performed Overall Cognitive Status: Within Functional Limits for tasks assessed                         Exercises Total Joint Exercises Towel Squeeze: AROM;Both;10 reps;Supine Goniometric ROM: long sitting in chair 22-60  AAROM General Exercises - Lower Extremity Ankle Circles/Pumps: AROM;Both;10 reps;Supine Quad Sets: AROM;Right;10 reps;Supine Short Arc Quad: AAROM;Right;10 reps;Supine Heel Slides: AAROM;Right;10 reps;Seated Hip ABduction/ADduction: AAROM;Right;10 reps;Supine Straight Leg Raises: AAROM;Right;10 reps;Supine      Assessment/Plan    PT Assessment Patient needs continued PT services  PT Diagnosis Difficulty walking;Abnormality of gait;Generalized weakness;Acute pain   PT Problem List Decreased strength;Decreased range of motion;Decreased balance;Decreased activity tolerance;Decreased mobility;Decreased knowledge of use of DME;Pain;Decreased knowledge of precautions  PT Treatment Interventions DME instruction;Gait training;Stair training;Functional mobility training;Therapeutic activities;Therapeutic exercise;Balance training;Neuromuscular re-education;Patient/family education;Modalities;Manual techniques   PT Goals (Current goals can be found in the Care Plan section) Acute Rehab PT Goals Patient Stated Goal: to go home this afternoon PT Goal Formulation: With patient Time For Goal Achievement: 11/21/15 Potential to Achieve Goals: Good    Frequency 7X/week           End of Session Equipment Utilized During Treatment: Gait belt Activity Tolerance: Patient limited by pain Patient left: in chair;with call bell/phone within reach;with family/visitor present           Time: [id number] PT Time Calculation (min) (ACUTE ONLY): 34 min   Charges:   PT Evaluation $Initial PT Evaluation Tier I: 1 Procedure PT Treatments $Gait Training: 8-22 mins        Atiana Levier B. Nakyla Bracco, PT, DPT [phone]   11/14/2015, 11:34 AM

## 2015-11-14 NOTE — Progress Notes (Signed)
Rolley Sims discharged home per MD order. Discharge instructions reviewed and discussed with patient. All questions and concerns answered. Copy of instructions and scripts given to patient. IV removed.   Patient escorted to car by staff in a wheelchair. No distress noted upon discharge.   Tarri Abernethy R 11/14/2015 7:03 PM

## 2015-11-14 NOTE — Op Note (Signed)
TOTAL KNEE REPLACEMENT OPERATIVE NOTE:  11/13/2015  3:12 PM  PATIENT:  Loretta Barnett  74 y.o. female  PRE-OPERATIVE DIAGNOSIS:  primary osteoarthritis right knee  POST-OPERATIVE DIAGNOSIS:  primary osteoarthritis right knee  PROCEDURE:  Procedure(s): TOTAL KNEE ARTHROPLASTY  SURGEON:  Surgeon(s): Vickey Huger, MD  PHYSICIAN ASSISTANT: Carlynn Spry, [facility]  ANESTHESIA:   spinal  DRAINS: Hemovac  SPECIMEN: None  COUNTS:  Correct  TOURNIQUET:   Total Tourniquet Time Documented: Thigh (Right) - 56 minutes Total: Thigh (Right) - 56 minutes   DICTATION:  Indication for procedure:    The patient is a 74 y.o. female who has failed conservative treatment for primary osteoarthritis right knee.  Informed consent was obtained prior to anesthesia. The risks versus benefits of the operation were explain and in a way the patient can, and did, understand.   On the implant demand matching protocol, this patient scored 8.  Therefore, this patient did" "did not receive a polyethylene insert with vitamin E which is a high demand implant.  Description of procedure:     The patient was taken to the operating room and placed under anesthesia.  The patient was positioned in the usual fashion taking care that all body parts were adequately padded and/or protected.  I foley catheter was not placed.  A tourniquet was applied and the leg prepped and draped in the usual sterile fashion.  The extremity was exsanguinated with the esmarch and tourniquet inflated to 350 mmHg.  Pre-operative range of motion was normal.  The knee was in 5 degree of mild varus.  A midline incision approximately 6-7 inches long was made with a #10 blade.  A new blade was used to make a parapatellar arthrotomy going 2-3 cm into the quadriceps tendon, over the patella, and alongside the medial aspect of the patellar tendon.  A synovectomy was then performed with the #10 blade and forceps. I then elevated the deep MCL off the  medial tibial metaphysis subperiosteally around to the semimembranosus attachment.    I everted the patella and used calipers to measure patellar thickness.  I used the reamer to ream down to appropriate thickness to recreate the native thickness.  I then removed excess bone with the rongeur and sagittal saw.  I used the appropriately sized template and drilled the three lug holes.  I then put the trial in place and measured the thickness with the calipers to ensure recreation of the native thickness.  The trial was then removed and the patella subluxed and the knee brought into flexion.  A homan retractor was place to retract and protect the patella and lateral structures.  A Z-retractor was place medially to protect the medial structures.  The extra-medullary alignment system was used to make cut the tibial articular surface perpendicular to the anamotic axis of the tibia and in 3 degrees of posterior slope.  The cut surface and alignment jig was removed.  I then used the intramedullary alignment guide to make a 6 valgus cut on the distal femur.  I then marked out the epicondylar axis on the distal femur.  The posterior condylar axis measured 3 degrees.  I then used the anterior referencing sizer and measured the femur to be a size 7.  The 4-In-1 cutting block was screwed into place in external rotation matching the posterior condylar angle, making our cuts perpendicular to the epicondylar axis.  Anterior, posterior and chamfer cuts were made with the sagittal saw.  The cutting block and cut pieces  were removed.  A lamina spreader was placed in 90 degrees of flexion.  The ACL, PCL, menisci, and posterior condylar osteophytes were removed.  A 12 mm spacer blocked was found to offer good flexion and extension gap balance after minimal in degree releasing.   The scoop retractor was then placed and the femoral finishing block was pinned in place.  The small sagittal saw was used as well as the lug drill to  finish the femur.  The block and cut surfaces were removed and the medullary canal hole filled with autograft bone from the cut pieces.  The tibia was delivered forward in deep flexion and external rotation.  A size E tray was selected and pinned into place centered on the medial 1/3 of the tibial tubercle.  The reamer and keel was used to prepare the tibia through the tray.    I then trialed with the size 7 femur, size E tibia, a 12 mm insert and the 35 patella.  I had excellent flexion/extension gap balance, excellent patella tracking.  Flexion was full and beyond 120 degrees; extension was zero.  These components were chosen and the staff opened them to me on the back table while the knee was lavaged copiously and the cement mixed.  The soft tissue was infiltrated with 60cc of exparel 1.3% through a 21 gauge needle.  I cemented in the components and removed all excess cement.  The polyethylene tibial component was snapped into place and the knee placed in extension while cement was hardening.  The capsule was infilltrated with 30cc of .25% Marcaine with epinephrine.  A hemovac was place in the joint exiting superolaterally.  A pain pump was place superomedially superficial to the arthrotomy.  Once the cement was hard, the tourniquet was let down.  Hemostasis was obtained.  The arthrotomy was closed with figure-8 #1 vicryl sutures.  The deep soft tissues were closed with #0 vicryls and the subcuticular layer closed with a running #2-0 vicryl.  The skin was reapproximated and closed with skin staples.  The wound was dressed with xeroform, 4 x4's, 2 ABD sponges, a single layer of webril and a TED stocking.   The patient was then awakened, extubated, and taken to the recovery room in stable condition.  BLOOD LOSS:  300cc DRAINS: 1 hemovac, 1 pain catheter COMPLICATIONS:  None.  PLAN OF CARE: Admit to inpatient   PATIENT DISPOSITION:  PACU - hemodynamically stable.   Delay start of Pharmacological  VTE agent (>24hrs) due to surgical blood loss or risk of bleeding:  not applicable  Please fax a copy of this op note to my office at [phone] (please only include page 1 and 2 of the Case Information op note)

## 2015-11-14 NOTE — Discharge Instructions (Signed)

## 2015-11-14 NOTE — Progress Notes (Signed)
Informed Dr. Ronnie Derby of pt blood pressure and that Celebrex now has been listed as allergy and reaction causes swelling to lower extremity. Arthor Captain LPN
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
## 2015-11-20 ENCOUNTER — Ambulatory Visit: Payer: Medicare Other | Attending: Orthopedic Surgery | Admitting: Physical Therapy

## 2015-11-20 DIAGNOSIS — M25561 Pain in right knee: Secondary | ICD-10-CM | POA: Insufficient documentation

## 2015-11-20 DIAGNOSIS — R5381 Other malaise: Secondary | ICD-10-CM

## 2015-11-20 DIAGNOSIS — M25461 Effusion, right knee: Secondary | ICD-10-CM | POA: Insufficient documentation

## 2015-11-20 DIAGNOSIS — R29898 Other symptoms and signs involving the musculoskeletal system: Secondary | ICD-10-CM | POA: Insufficient documentation

## 2015-11-20 DIAGNOSIS — M25661 Stiffness of right knee, not elsewhere classified: Secondary | ICD-10-CM | POA: Insufficient documentation
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
## 2015-11-20 NOTE — Discharge Summary (Signed)
SPORTS MEDICINE & JOINT REPLACEMENT   Lara Mulch, MD   Carlynn Spry, PA-C [place], [place], [place]  [address]                             [phone]  PATIENT ID: Loretta Barnett        MRN:  [id number]          DOB/AGE: [birth year] / 74 y.o.    DISCHARGE SUMMARY  ADMISSION DATE:    11/13/2015 DISCHARGE DATE:   11/14/2015  ADMISSION DIAGNOSIS: primary osteoarthritis right knee    DISCHARGE DIAGNOSIS:  primary osteoarthritis right knee    ADDITIONAL DIAGNOSIS: Active Problems:   S/P total knee arthroplasty  Past Medical History  Diagnosis Date  . Arthritis   . Seasonal allergies   . Diabetes mellitus   . Hypertension   . Obesity   . Colon polyp   . Complication of anesthesia   . PONV (postoperative nausea and vomiting)   . GERD (gastroesophageal reflux disease)     Tums  . Anemia     PROCEDURE: Procedure(s): TOTAL KNEE ARTHROPLASTY on 11/13/2015  CONSULTS:     HISTORY:  See H&P in chart  HOSPITAL COURSE:  Loretta Barnett is a 74 y.o. admitted on 11/13/2015 and found to have a diagnosis of primary osteoarthritis right knee.  After appropriate laboratory studies were obtained  they were taken to the operating room on 11/13/2015 and underwent Procedure(s): TOTAL KNEE ARTHROPLASTY.   They were given perioperative antibiotics:  Anti-infectives    Start     Dose/Rate Route Frequency Ordered Stop   11/13/15 1730  ceFAZolin (ANCEF) IVPB 1 g/50 mL premix     1 g 100 mL/hr over 30 Minutes Intravenous Every 6 hours 11/13/15 1612 11/13/15 2328   11/13/15 1100  ceFAZolin (ANCEF) IVPB 2 g/50 mL premix     2 g 100 mL/hr over 30 Minutes Intravenous To [facility]Stay [facility] 11/10/15 1313 11/13/15 1202    .  Tolerated the procedure well.  Placed with a foley intraoperatively.  Given Ofirmev at induction and for 48 hours.    POD# 1: Vital signs were stable.  Patient denied Chest pain, shortness of breath, or calf pain.  Patient was started on Lovenox 30 mg  subcutaneously twice daily at 8am.  Consults to PT, OT, and care management were made.  The patient was weight bearing as tolerated.  CPM was placed on the operative leg 0-90 degrees for 6-8 hours a day.  Incentive spirometry was taught.  Dressing was changed.       Continued  PT for ambulation and exercise program.  IV saline locked.  O2 discontinued.    The remainder of the hospital course was dedicated to ambulation and strengthening.   The patient was discharged on day 1 post op in  Good condition.  Blood products given:none  DIAGNOSTIC STUDIES: Recent vital signs: No data found.      Recent laboratory studies:  Recent Labs  11/13/15 1653 11/14/15 0426  WBC 5.9 6.5  HGB 9.7* 8.2*  HCT 31.1* 26.5*  PLT 269 270    Recent Labs  11/13/15 1653 11/14/15 0426  NA  --  139  K  --  3.2*  CL  --  102  CO2  --  28  BUN  --  15  CREATININE 0.91 1.05*  GLUCOSE  --  162*  CALCIUM  --  8.6*  Lab Results  Component Value Date   INR 1.03 11/03/2015   INR 1.01 03/24/2015     Recent Radiographic Studies :  Dg Chest 2 View  11/03/2015  CLINICAL DATA:  Preop imaging for right knee replacement scheduled for 11/13/2015. History of hypertension and diabetes. EXAM: CHEST  2 VIEW COMPARISON:  03/24/2015 FINDINGS: Cardiac silhouette is normal in size and configuration. The aorta is mildly uncoiled. No mediastinal or hilar masses or evidence of adenopathy. Clear lungs.  No pleural effusion or pneumothorax. Bony thorax is demineralized but intact. IMPRESSION: No acute cardiopulmonary disease. Electronically Signed   By: Lajean Manes M.D.   On: 11/03/2015 14:47    DISCHARGE INSTRUCTIONS: Discharge Instructions    CPM    Complete by:  As directed   Continuous passive motion machine (CPM):      Use the CPM from 0 to 90 for 6-8 hours per day.      You may increase by 10 per day.  You may break it up into 2 or 3 sessions per day.      Use CPM for 2 weeks or until you are told to stop.      Call MD / Call 911    Complete by:  As directed   If you experience chest pain or shortness of breath, CALL 911 and be transported to the hospital emergency room.  If you develope a fever above 101 F, pus (white drainage) or increased drainage or redness at the wound, or calf pain, call your surgeon's office.     Change dressing    Complete by:  As directed   Change dressing on Wednesday, then change the dressing daily with sterile 4 x 4 inch gauze dressing and apply TED hose.     Constipation Prevention    Complete by:  As directed   Drink plenty of fluids.  Prune juice may be helpful.  You may use a stool softener, such as Colace (over the counter) 100 mg twice a day.  Use MiraLax (over the counter) for constipation as needed.     Diet - low sodium heart healthy    Complete by:  As directed      Do not put a pillow under the knee. Place it under the heel.    Complete by:  As directed      Driving restrictions    Complete by:  As directed   No driving for 6 weeks     Increase activity slowly as tolerated    Complete by:  As directed      Lifting restrictions    Complete by:  As directed   No lifting for 6 weeks     TED hose    Complete by:  As directed   Use stockings (TED hose) for 2 weeks on both leg(s).  You may remove them at night for sleeping.           DISCHARGE MEDICATIONS:     Medication List    STOP taking these medications        aspirin EC 81 MG tablet      TAKE these medications        acetaminophen 500 MG tablet  Commonly known as:  TYLENOL  Take 500-1,000 mg by mouth daily as needed for mild pain.     ALIGN 4 MG Caps  Take 1 capsule by mouth daily before breakfast.     alum & mag hydroxide-simeth 200-200-20 MG/5ML suspension  Commonly known  as:  MAALOX/MYLANTA  Take 30 mLs by mouth every 4 (four) hours as needed for indigestion.     cetirizine 10 MG tablet  Commonly known as:  ZYRTEC  Take 10 mg by mouth daily as needed for allergies.      Cholecalciferol 1000 UNITS tablet  Take 1 tablet (1,000 Units total) by mouth 2 (two) times daily.     enoxaparin 40 MG/0.4ML injection  Commonly known as:  LOVENOX  Inject 0.4 mLs (40 mg total) into the skin daily.     furosemide 20 MG tablet  Commonly known as:  LASIX  Take 20 mg by mouth daily.     HYDROcodone-acetaminophen 5-325 MG tablet  Commonly known as:  NORCO  Take 1-2 tablets by mouth every 6 (six) hours as needed for moderate pain.     ICAPS PO  Take 1 tablet by mouth daily.     losartan-hydrochlorothiazide 100-25 MG tablet  Commonly known as:  HYZAAR  Take 1 tablet by mouth daily.     METAMUCIL PO  Take 30 mLs by mouth daily. Mix 2 tablespoons (30 mls) powder in 16 oz water and drink     metFORMIN 500 MG tablet  Commonly known as:  GLUCOPHAGE  Take 500 mg by mouth 2 (two) times daily with a meal.     methocarbamol 500 MG tablet  Commonly known as:  ROBAXIN  Take 1-2 tablets (500-1,000 mg total) by mouth every 6 (six) hours as needed for muscle spasms.     nabumetone 500 MG tablet  Commonly known as:  RELAFEN  Take 1-2 tablets (500-1,000 mg total) by mouth 2 (two) times daily as needed for moderate pain.     naproxen sodium 220 MG tablet  Commonly known as:  ANAPROX  Take 220 mg by mouth daily as needed (pain). ALEVE     ondansetron 4 MG tablet  Commonly known as:  ZOFRAN  Take 1-2 tablets (4-8 mg total) by mouth every 8 (eight) hours as needed for nausea or vomiting.     OVER THE COUNTER MEDICATION  Take 3 capsules by mouth daily. Bone-Up     potassium chloride SA 20 MEQ tablet  Commonly known as:  K-DUR,KLOR-CON  Take 20 mEq by mouth daily after supper.     traMADol 50 MG tablet  Commonly known as:  ULTRAM  Take 1-2 tablets (50-100 mg total) by mouth every 4 (four) hours as needed for moderate pain.     vitamin C 500 MG tablet  Commonly known as:  ASCORBIC ACID  Take 500 mg by mouth daily.        FOLLOW UP VISIT:       Follow-up  Information    Follow up with [facility].   Why:  They will contact you to schedule home therapy visits.   Contact information:   [address] [place] [address] [phone]       Follow up with Rudean Haskell, MD. Call on 11/28/2015.   Specialty:  Orthopedic Surgery   Contact information:   [place] [address] [phone]       DISPOSITION: HOME   CONDITION:  Good   Evaleen Sant 11/20/2015, 9:59 AM

## 2015-11-23 ENCOUNTER — Ambulatory Visit: Payer: Medicare Other | Admitting: Physical Therapy

## 2015-11-23 DIAGNOSIS — M25461 Effusion, right knee: Secondary | ICD-10-CM

## 2015-11-23 DIAGNOSIS — R5381 Other malaise: Secondary | ICD-10-CM

## 2015-11-23 DIAGNOSIS — M25561 Pain in right knee: Secondary | ICD-10-CM

## 2015-11-23 DIAGNOSIS — M25661 Stiffness of right knee, not elsewhere classified: Secondary | ICD-10-CM
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
## 2015-11-27 ENCOUNTER — Ambulatory Visit: Payer: Medicare Other | Admitting: Physical Therapy

## 2015-11-27 ENCOUNTER — Encounter: Payer: Self-pay | Admitting: Physical Therapy

## 2015-11-27 DIAGNOSIS — M25461 Effusion, right knee: Secondary | ICD-10-CM

## 2015-11-27 DIAGNOSIS — M25561 Pain in right knee: Secondary | ICD-10-CM

## 2015-11-27 DIAGNOSIS — R5381 Other malaise: Secondary | ICD-10-CM

## 2015-11-27 DIAGNOSIS — M25661 Stiffness of right knee, not elsewhere classified: Secondary | ICD-10-CM
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
## 2015-11-29 ENCOUNTER — Ambulatory Visit: Payer: Medicare Other | Admitting: Physical Therapy

## 2015-11-29 ENCOUNTER — Encounter: Payer: Self-pay | Admitting: Physical Therapy

## 2015-11-29 DIAGNOSIS — M25561 Pain in right knee: Secondary | ICD-10-CM | POA: Diagnosis not present

## 2015-11-29 DIAGNOSIS — M25661 Stiffness of right knee, not elsewhere classified: Secondary | ICD-10-CM

## 2015-11-29 DIAGNOSIS — M25461 Effusion, right knee: Secondary | ICD-10-CM

## 2015-11-29 DIAGNOSIS — R5381 Other malaise: Secondary | ICD-10-CM
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
## 2015-12-04 ENCOUNTER — Ambulatory Visit: Payer: Medicare Other | Admitting: Physical Therapy

## 2015-12-04 ENCOUNTER — Encounter: Payer: Self-pay | Admitting: Physical Therapy

## 2015-12-04 DIAGNOSIS — M25561 Pain in right knee: Secondary | ICD-10-CM

## 2015-12-04 DIAGNOSIS — M25461 Effusion, right knee: Secondary | ICD-10-CM

## 2015-12-04 DIAGNOSIS — M25661 Stiffness of right knee, not elsewhere classified: Secondary | ICD-10-CM

## 2015-12-04 DIAGNOSIS — R5381 Other malaise: Secondary | ICD-10-CM
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
## 2015-12-06 ENCOUNTER — Ambulatory Visit: Payer: Medicare Other | Admitting: Physical Therapy

## 2015-12-06 ENCOUNTER — Encounter: Payer: Self-pay | Admitting: Physical Therapy

## 2015-12-06 DIAGNOSIS — M25661 Stiffness of right knee, not elsewhere classified: Secondary | ICD-10-CM

## 2015-12-06 DIAGNOSIS — M25561 Pain in right knee: Secondary | ICD-10-CM | POA: Diagnosis not present

## 2015-12-06 DIAGNOSIS — R5381 Other malaise: Secondary | ICD-10-CM

## 2015-12-06 DIAGNOSIS — M25461 Effusion, right knee: Secondary | ICD-10-CM
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
## 2015-12-13 ENCOUNTER — Ambulatory Visit: Payer: Medicare Other | Admitting: Physical Therapy

## 2015-12-13 ENCOUNTER — Encounter: Payer: Self-pay | Admitting: Physical Therapy

## 2015-12-13 DIAGNOSIS — M25561 Pain in right knee: Secondary | ICD-10-CM

## 2015-12-13 DIAGNOSIS — M25661 Stiffness of right knee, not elsewhere classified: Secondary | ICD-10-CM

## 2015-12-13 DIAGNOSIS — M25461 Effusion, right knee: Secondary | ICD-10-CM

## 2015-12-13 DIAGNOSIS — R5381 Other malaise: Secondary | ICD-10-CM

## 2015-12-13 NOTE — Patient Instructions (Signed)
Stretching: Hamstring (Standing)    Place right foot on stool. Slowly lean forward, keeping back straight, until stretch is felt in back of thigh. Hold __30__ seconds. Repeat __3__ times per set. Do __1__ sets per session. Do __3__ sessions per day.  http://orth.exer.us/658   Copyright  VHI. All rights reserved.  Knee Extension Mobilization: Hang (Prone)    With table supporting thighs, place ____ pound weight on right ankle. Hold for a few minutes a few times daily.  http://orth.exer.us/722   Copyright  VHI. All rights reserved.
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
## 2015-12-20 ENCOUNTER — Ambulatory Visit: Payer: Medicare Other | Attending: Orthopedic Surgery | Admitting: Physical Therapy

## 2015-12-20 DIAGNOSIS — R29898 Other symptoms and signs involving the musculoskeletal system: Secondary | ICD-10-CM | POA: Diagnosis not present

## 2015-12-20 DIAGNOSIS — M25461 Effusion, right knee: Secondary | ICD-10-CM | POA: Diagnosis present

## 2015-12-20 DIAGNOSIS — M25561 Pain in right knee: Secondary | ICD-10-CM | POA: Diagnosis present

## 2015-12-20 DIAGNOSIS — M25661 Stiffness of right knee, not elsewhere classified: Secondary | ICD-10-CM | POA: Insufficient documentation

## 2015-12-20 DIAGNOSIS — R5381 Other malaise: Secondary | ICD-10-CM | POA: Insufficient documentation
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
## 2015-12-25 ENCOUNTER — Encounter: Payer: Medicare Other | Admitting: Physical Therapy

## 2015-12-27 ENCOUNTER — Encounter: Payer: Self-pay | Admitting: Physical Therapy

## 2015-12-27 ENCOUNTER — Ambulatory Visit: Payer: Medicare Other | Admitting: Physical Therapy

## 2015-12-27 DIAGNOSIS — R29898 Other symptoms and signs involving the musculoskeletal system: Secondary | ICD-10-CM | POA: Diagnosis not present

## 2015-12-27 DIAGNOSIS — M25661 Stiffness of right knee, not elsewhere classified: Secondary | ICD-10-CM

## 2015-12-27 DIAGNOSIS — M25561 Pain in right knee: Secondary | ICD-10-CM

## 2015-12-27 DIAGNOSIS — R5381 Other malaise: Secondary | ICD-10-CM

## 2015-12-27 DIAGNOSIS — M25461 Effusion, right knee: Secondary | ICD-10-CM
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
## 2016-01-01 ENCOUNTER — Encounter: Payer: Self-pay | Admitting: Physical Therapy

## 2016-01-01 ENCOUNTER — Ambulatory Visit: Payer: Medicare Other | Admitting: Physical Therapy

## 2016-01-01 DIAGNOSIS — M25561 Pain in right knee: Secondary | ICD-10-CM

## 2016-01-01 DIAGNOSIS — M25461 Effusion, right knee: Secondary | ICD-10-CM

## 2016-01-01 DIAGNOSIS — M25661 Stiffness of right knee, not elsewhere classified: Secondary | ICD-10-CM

## 2016-01-01 DIAGNOSIS — R5381 Other malaise: Secondary | ICD-10-CM

## 2016-01-01 DIAGNOSIS — R29898 Other symptoms and signs involving the musculoskeletal system: Secondary | ICD-10-CM | POA: Diagnosis not present
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
## 2016-01-08 ENCOUNTER — Ambulatory Visit: Payer: Medicare Other | Admitting: Physical Therapy

## 2016-01-08 ENCOUNTER — Encounter: Payer: Self-pay | Admitting: Physical Therapy

## 2016-01-08 DIAGNOSIS — M25661 Stiffness of right knee, not elsewhere classified: Secondary | ICD-10-CM

## 2016-01-08 DIAGNOSIS — M25561 Pain in right knee: Secondary | ICD-10-CM

## 2016-01-08 DIAGNOSIS — M25461 Effusion, right knee: Secondary | ICD-10-CM

## 2016-01-08 DIAGNOSIS — R5381 Other malaise: Secondary | ICD-10-CM

## 2016-01-08 DIAGNOSIS — R29898 Other symptoms and signs involving the musculoskeletal system: Secondary | ICD-10-CM | POA: Diagnosis not present
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
## 2016-01-10 ENCOUNTER — Encounter: Payer: Self-pay | Admitting: Physical Therapy

## 2016-01-10 ENCOUNTER — Ambulatory Visit: Payer: Medicare Other | Admitting: Physical Therapy

## 2016-01-10 DIAGNOSIS — R5381 Other malaise: Secondary | ICD-10-CM

## 2016-01-10 DIAGNOSIS — M25561 Pain in right knee: Secondary | ICD-10-CM

## 2016-01-10 DIAGNOSIS — M25461 Effusion, right knee: Secondary | ICD-10-CM

## 2016-01-10 DIAGNOSIS — R29898 Other symptoms and signs involving the musculoskeletal system: Secondary | ICD-10-CM | POA: Diagnosis not present

## 2016-01-10 DIAGNOSIS — M25661 Stiffness of right knee, not elsewhere classified: Secondary | ICD-10-CM
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
## 2016-01-15 ENCOUNTER — Encounter: Payer: Self-pay | Admitting: Physical Therapy

## 2016-01-15 ENCOUNTER — Ambulatory Visit: Payer: Medicare Other | Admitting: Physical Therapy

## 2016-01-15 DIAGNOSIS — R5381 Other malaise: Secondary | ICD-10-CM

## 2016-01-15 DIAGNOSIS — M25561 Pain in right knee: Secondary | ICD-10-CM

## 2016-01-15 DIAGNOSIS — M25661 Stiffness of right knee, not elsewhere classified: Secondary | ICD-10-CM

## 2016-01-15 DIAGNOSIS — R29898 Other symptoms and signs involving the musculoskeletal system: Secondary | ICD-10-CM | POA: Diagnosis not present

## 2016-01-15 DIAGNOSIS — M25461 Effusion, right knee: Secondary | ICD-10-CM
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
## 2016-01-17 ENCOUNTER — Ambulatory Visit: Payer: Medicare Other | Attending: Orthopedic Surgery | Admitting: Physical Therapy

## 2016-01-17 ENCOUNTER — Encounter: Payer: Self-pay | Admitting: Physical Therapy

## 2016-01-17 DIAGNOSIS — M25661 Stiffness of right knee, not elsewhere classified: Secondary | ICD-10-CM | POA: Insufficient documentation

## 2016-01-17 DIAGNOSIS — M25561 Pain in right knee: Secondary | ICD-10-CM | POA: Insufficient documentation

## 2016-01-17 DIAGNOSIS — R29898 Other symptoms and signs involving the musculoskeletal system: Secondary | ICD-10-CM | POA: Insufficient documentation

## 2016-01-17 DIAGNOSIS — R5381 Other malaise: Secondary | ICD-10-CM | POA: Insufficient documentation

## 2016-01-17 DIAGNOSIS — M25461 Effusion, right knee: Secondary | ICD-10-CM | POA: Insufficient documentation
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
## 2016-01-22 ENCOUNTER — Encounter: Payer: Self-pay | Admitting: Physical Therapy

## 2016-01-22 ENCOUNTER — Ambulatory Visit: Payer: Medicare Other | Admitting: Physical Therapy

## 2016-01-22 DIAGNOSIS — R29898 Other symptoms and signs involving the musculoskeletal system: Secondary | ICD-10-CM | POA: Diagnosis not present

## 2016-01-22 DIAGNOSIS — R5381 Other malaise: Secondary | ICD-10-CM

## 2016-01-22 DIAGNOSIS — M25561 Pain in right knee: Secondary | ICD-10-CM

## 2016-01-22 DIAGNOSIS — M25661 Stiffness of right knee, not elsewhere classified: Secondary | ICD-10-CM

## 2016-01-22 DIAGNOSIS — M25461 Effusion, right knee: Secondary | ICD-10-CM
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
## 2016-01-24 ENCOUNTER — Encounter: Payer: Self-pay | Admitting: Physical Therapy

## 2016-01-24 ENCOUNTER — Ambulatory Visit: Payer: Medicare Other | Admitting: Physical Therapy

## 2016-01-24 DIAGNOSIS — M25561 Pain in right knee: Secondary | ICD-10-CM

## 2016-01-24 DIAGNOSIS — M25661 Stiffness of right knee, not elsewhere classified: Secondary | ICD-10-CM

## 2016-01-24 DIAGNOSIS — R5381 Other malaise: Secondary | ICD-10-CM

## 2016-01-24 DIAGNOSIS — M25461 Effusion, right knee: Secondary | ICD-10-CM

## 2016-01-24 DIAGNOSIS — R29898 Other symptoms and signs involving the musculoskeletal system: Secondary | ICD-10-CM | POA: Diagnosis not present
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
## 2016-01-29 ENCOUNTER — Ambulatory Visit: Payer: Medicare Other | Admitting: Physical Therapy

## 2016-01-29 ENCOUNTER — Encounter: Payer: Self-pay | Admitting: Physical Therapy

## 2016-01-29 DIAGNOSIS — M25561 Pain in right knee: Secondary | ICD-10-CM

## 2016-01-29 DIAGNOSIS — M25461 Effusion, right knee: Secondary | ICD-10-CM

## 2016-01-29 DIAGNOSIS — R5381 Other malaise: Secondary | ICD-10-CM

## 2016-01-29 DIAGNOSIS — M25661 Stiffness of right knee, not elsewhere classified: Secondary | ICD-10-CM

## 2016-01-29 DIAGNOSIS — R29898 Other symptoms and signs involving the musculoskeletal system: Secondary | ICD-10-CM | POA: Diagnosis not present
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
## 2016-01-31 ENCOUNTER — Ambulatory Visit: Payer: Medicare Other | Admitting: Physical Therapy

## 2016-01-31 ENCOUNTER — Encounter: Payer: Self-pay | Admitting: Physical Therapy

## 2016-01-31 DIAGNOSIS — R29898 Other symptoms and signs involving the musculoskeletal system: Secondary | ICD-10-CM | POA: Diagnosis not present

## 2016-01-31 DIAGNOSIS — M25661 Stiffness of right knee, not elsewhere classified: Secondary | ICD-10-CM

## 2016-01-31 DIAGNOSIS — M25561 Pain in right knee: Secondary | ICD-10-CM

## 2016-01-31 DIAGNOSIS — M25461 Effusion, right knee: Secondary | ICD-10-CM

## 2016-01-31 DIAGNOSIS — R5381 Other malaise: Secondary | ICD-10-CM
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
## 2016-02-05 ENCOUNTER — Encounter: Payer: Self-pay | Admitting: Physical Therapy

## 2016-02-05 ENCOUNTER — Ambulatory Visit: Payer: Medicare Other | Admitting: Physical Therapy

## 2016-02-05 DIAGNOSIS — M25661 Stiffness of right knee, not elsewhere classified: Secondary | ICD-10-CM

## 2016-02-05 DIAGNOSIS — R29898 Other symptoms and signs involving the musculoskeletal system: Secondary | ICD-10-CM | POA: Diagnosis not present

## 2016-02-05 DIAGNOSIS — R5381 Other malaise: Secondary | ICD-10-CM

## 2016-02-05 DIAGNOSIS — M25461 Effusion, right knee: Secondary | ICD-10-CM
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
## 2016-02-07 ENCOUNTER — Encounter: Payer: Self-pay | Admitting: Physical Therapy

## 2016-02-07 ENCOUNTER — Ambulatory Visit: Payer: Medicare Other | Admitting: Physical Therapy

## 2016-02-07 DIAGNOSIS — M25561 Pain in right knee: Secondary | ICD-10-CM

## 2016-02-07 DIAGNOSIS — M25461 Effusion, right knee: Secondary | ICD-10-CM

## 2016-02-07 DIAGNOSIS — M25661 Stiffness of right knee, not elsewhere classified: Secondary | ICD-10-CM

## 2016-02-07 DIAGNOSIS — R29898 Other symptoms and signs involving the musculoskeletal system: Secondary | ICD-10-CM | POA: Diagnosis not present

## 2016-02-07 DIAGNOSIS — R5381 Other malaise: Secondary | ICD-10-CM
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
## 2016-02-12 ENCOUNTER — Encounter: Payer: Self-pay | Admitting: Physical Therapy

## 2016-02-12 ENCOUNTER — Ambulatory Visit: Payer: Medicare Other | Admitting: Physical Therapy

## 2016-02-12 DIAGNOSIS — R29898 Other symptoms and signs involving the musculoskeletal system: Secondary | ICD-10-CM | POA: Diagnosis not present

## 2016-02-12 DIAGNOSIS — M25661 Stiffness of right knee, not elsewhere classified: Secondary | ICD-10-CM

## 2016-02-12 DIAGNOSIS — R5381 Other malaise: Secondary | ICD-10-CM

## 2016-02-12 DIAGNOSIS — M25461 Effusion, right knee: Secondary | ICD-10-CM

## 2016-02-12 DIAGNOSIS — M25561 Pain in right knee: Secondary | ICD-10-CM
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
## 2017-09-10 ENCOUNTER — Other Ambulatory Visit (HOSPITAL_COMMUNITY): Payer: Self-pay | Admitting: Family Medicine

## 2017-09-10 DIAGNOSIS — M542 Cervicalgia: Secondary | ICD-10-CM

## 2017-09-17 ENCOUNTER — Ambulatory Visit (HOSPITAL_COMMUNITY)
Admission: RE | Admit: 2017-09-17 | Discharge: 2017-09-17 | Disposition: A | Discharge status: Home / Self Care | Payer: Medicare Other | Source: Ambulatory Visit | Attending: Family Medicine | Admitting: Family Medicine

## 2017-09-17 DIAGNOSIS — M542 Cervicalgia: Secondary | ICD-10-CM

## 2017-09-17 DIAGNOSIS — M4802 Spinal stenosis, cervical region: Secondary | ICD-10-CM | POA: Diagnosis not present

## 2017-09-17 DIAGNOSIS — M25512 Pain in left shoulder: Secondary | ICD-10-CM | POA: Insufficient documentation

## 2017-09-17 DIAGNOSIS — G8929 Other chronic pain: Secondary | ICD-10-CM | POA: Diagnosis not present

## 2017-09-17 DIAGNOSIS — M5030 Other cervical disc degeneration, unspecified cervical region: Secondary | ICD-10-CM | POA: Diagnosis not present

## 2017-10-08 DIAGNOSIS — M48061 Spinal stenosis, lumbar region without neurogenic claudication: Secondary | ICD-10-CM | POA: Insufficient documentation

## 2018-02-16 ENCOUNTER — Ambulatory Visit (HOSPITAL_COMMUNITY)
Admission: RE | Admit: 2018-02-16 | Discharge: 2018-02-16 | Disposition: A | Discharge status: Home / Self Care | Payer: Medicare Other | Source: Ambulatory Visit | Attending: Family Medicine | Admitting: Family Medicine

## 2018-02-16 ENCOUNTER — Other Ambulatory Visit (HOSPITAL_COMMUNITY): Payer: Self-pay | Admitting: Family Medicine

## 2018-02-16 DIAGNOSIS — M160 Bilateral primary osteoarthritis of hip: Secondary | ICD-10-CM | POA: Insufficient documentation

## 2018-02-16 DIAGNOSIS — M25552 Pain in left hip: Secondary | ICD-10-CM | POA: Diagnosis present

## 2018-02-23 ENCOUNTER — Ambulatory Visit (INDEPENDENT_AMBULATORY_CARE_PROVIDER_SITE_OTHER): Payer: Medicare Other | Admitting: Otolaryngology

## 2018-02-23 DIAGNOSIS — H6983 Other specified disorders of Eustachian tube, bilateral: Secondary | ICD-10-CM | POA: Diagnosis not present

## 2018-02-23 DIAGNOSIS — J310 Chronic rhinitis: Secondary | ICD-10-CM

## 2018-06-15 ENCOUNTER — Other Ambulatory Visit (HOSPITAL_COMMUNITY): Payer: Self-pay | Admitting: Family Medicine

## 2018-06-15 ENCOUNTER — Ambulatory Visit (HOSPITAL_COMMUNITY)
Admission: RE | Admit: 2018-06-15 | Discharge: 2018-06-15 | Disposition: A | Discharge status: Home / Self Care | Payer: Medicare Other | Source: Ambulatory Visit | Attending: Family Medicine | Admitting: Family Medicine

## 2018-06-15 DIAGNOSIS — G8929 Other chronic pain: Secondary | ICD-10-CM

## 2018-06-15 DIAGNOSIS — Z8781 Personal history of (healed) traumatic fracture: Secondary | ICD-10-CM | POA: Diagnosis not present

## 2018-06-15 DIAGNOSIS — R918 Other nonspecific abnormal finding of lung field: Secondary | ICD-10-CM | POA: Diagnosis not present

## 2018-06-15 DIAGNOSIS — M5134 Other intervertebral disc degeneration, thoracic region: Secondary | ICD-10-CM | POA: Diagnosis not present

## 2019-03-04 ENCOUNTER — Encounter: Payer: Self-pay | Admitting: Internal Medicine

## 2019-05-25 ENCOUNTER — Telehealth: Payer: Self-pay

## 2019-05-25 NOTE — Telephone Encounter (Signed)
Patient was called to see if she was still taking Lovenox. Patient confirmed that she only took it 20 days after knee surgery back in 2016 and is no longer on Levonox. Patient was informed that we will proceed with pre-visit and scheduled colonoscopy.   Riki Sheer, LPN ( PV )

## 2019-05-25 NOTE — Telephone Encounter (Signed)
error 

## 2019-05-31 ENCOUNTER — Other Ambulatory Visit: Payer: Self-pay

## 2019-05-31 ENCOUNTER — Telehealth: Payer: Self-pay | Admitting: Internal Medicine

## 2019-05-31 ENCOUNTER — Ambulatory Visit (AMBULATORY_SURGERY_CENTER): Payer: Self-pay

## 2019-05-31 VITALS — Ht 64.5 in | Wt 180.0 lb

## 2019-05-31 DIAGNOSIS — Z800 Family history of malignant neoplasm of digestive organs: Secondary | ICD-10-CM

## 2019-05-31 MED ORDER — NA SULFATE-K SULFATE-MG SULF 17.5-3.13-1.6 GM/177ML PO SOLN: 1.0000 | Freq: Once | ORAL | 0 refills | Status: AC

## 2019-05-31 NOTE — Progress Notes (Signed)
Denies allergies to eggs or soy products. Denies complication of anesthesia or sedation. Denies use of weight loss medication. Denies use of O2.   Emmi instructions given for colonoscopy.  Pre-Visit was conducted by phone due to Covid 19. Instructions were reviewed and mailed to patients confirmed home address. Patient was encouraged to call if she had any questions or concerns regarding instructions. A 15.00 coupon for Suprep was given to the patient.

## 2019-05-31 NOTE — Telephone Encounter (Signed)
Pt's insurance does not cover Suprep--prefers Gavilyte or Peg

## 2019-05-31 NOTE — Telephone Encounter (Signed)
Patient is going to call her insurance company and then her pharmacy. She does not know the cost of Suprep. If it's too expensive the patient will call us back.

## 2019-06-04 ENCOUNTER — Other Ambulatory Visit: Payer: Self-pay

## 2019-06-09 ENCOUNTER — Other Ambulatory Visit: Payer: Self-pay | Admitting: *Deleted

## 2019-06-09 DIAGNOSIS — Z20822 Contact with and (suspected) exposure to covid-19: Secondary | ICD-10-CM

## 2019-06-09 LAB — NOVEL CORONAVIRUS, NAA
SARS-CoV-2, NAA: NOT DETECTED
SARS-CoV-2, NAA: NOT DETECTED

## 2019-06-11 ENCOUNTER — Telehealth: Payer: Self-pay | Admitting: Family Medicine

## 2019-06-11 ENCOUNTER — Telehealth: Payer: Self-pay | Admitting: Internal Medicine

## 2019-06-11 NOTE — Telephone Encounter (Signed)
°  Pt was given COVID results

## 2019-06-11 NOTE — Telephone Encounter (Signed)
Pharmacy called and stated that prep was not covered by insurance and if something else can be called in for the patient.

## 2019-06-11 NOTE — Telephone Encounter (Signed)

## 2019-06-11 NOTE — Telephone Encounter (Signed)
Patient was called regarding the cost of Suprep. No answer. I spoke to [facility] at the patients pharmacy and was told that originally the prep was going to cost 145.00. Patient was requesting another prep. Larene Beach informed me that she came up with a coupon that reduced the cost to 75.00 so the patient picked up the prep. Larene Beach explained that the cheaper prep would be a lot more volume to drink so she was ok with paying the 75.00 dollars for the prep. I left a message on Rashia's voicemail stating that I spoke to [facility] and was told that she did get her prep.   Riki Sheer, LPN ( PV )

## 2019-06-14 ENCOUNTER — Other Ambulatory Visit: Payer: Self-pay

## 2019-06-14 ENCOUNTER — Encounter: Payer: Self-pay | Admitting: Internal Medicine

## 2019-06-14 ENCOUNTER — Ambulatory Visit (AMBULATORY_SURGERY_CENTER): Payer: Medicare Other | Admitting: Internal Medicine

## 2019-06-14 VITALS — BP 120/61 | HR 71 | Temp 97.2°F | Resp 14 | Ht 63.5 in | Wt 188.0 lb

## 2019-06-14 DIAGNOSIS — Z1211 Encounter for screening for malignant neoplasm of colon: Secondary | ICD-10-CM | POA: Diagnosis not present

## 2019-06-14 DIAGNOSIS — D123 Benign neoplasm of transverse colon: Secondary | ICD-10-CM

## 2019-06-14 DIAGNOSIS — Z800 Family history of malignant neoplasm of digestive organs: Secondary | ICD-10-CM | POA: Diagnosis present

## 2019-06-14 MED ORDER — SODIUM CHLORIDE 0.9 % IV SOLN: 500.0000 mL | Freq: Once | INTRAVENOUS | Status: DC

## 2019-06-14 NOTE — Progress Notes (Signed)
Report to PACU, RN, vss, BBS= Clear.  

## 2019-06-14 NOTE — Patient Instructions (Signed)
Thank you for allowing [place] to care for you today!  Resume previous diet and medications today.  Return to your normal activities tomorrow.  No further screening colonoscopies  are recommended based on the current age guidelines.   YOU HAD AN ENDOSCOPIC PROCEDURE TODAY AT [facility]:   Refer to the procedure report that was given to you for any specific questions about what was found during the examination.  If the procedure report does not answer your questions, please call your gastroenterologist to clarify.  If you requested that your care partner not be given the details of your procedure findings, then the procedure report has been included in a sealed envelope for you to review at your convenience later.  YOU SHOULD EXPECT: Some feelings of bloating in the abdomen. Passage of more gas than usual.  Walking can help get rid of the air that was put into your GI tract during the procedure and reduce the bloating. If you had a lower endoscopy (such as a colonoscopy or flexible sigmoidoscopy) you may notice spotting of blood in your stool or on the toilet paper. If you underwent a bowel prep for your procedure, you may not have a normal bowel movement for a few days.  Please Note:  You might notice some irritation and congestion in your nose or some drainage.  This is from the oxygen used during your procedure.  There is no need for concern and it should clear up in a day or so.  SYMPTOMS TO REPORT IMMEDIATELY:   Following lower endoscopy (colonoscopy or flexible sigmoidoscopy):  Excessive amounts of blood in the stool  Significant tenderness or worsening of abdominal pains  Swelling of the abdomen that is new, acute  Fever of 100F or higher   For urgent or emergent issues, a gastroenterologist can be reached at any hour by calling [phone].   DIET:  We do recommend a small meal at first, but then you may proceed to your regular diet.  Drink plenty of fluids but you  should avoid alcoholic beverages for 24 hours.  ACTIVITY:  You should plan to take it easy for the rest of today and you should NOT DRIVE or use heavy machinery until tomorrow (because of the sedation medicines used during the test).    FOLLOW UP: Our staff will call the number listed on your records 48-72 hours following your procedure to check on you and address any questions or concerns that you may have regarding the information given to you following your procedure. If we do not reach you, we will leave a message.  We will attempt to reach you two times.  During this call, we will ask if you have developed any symptoms of COVID 19. If you develop any symptoms (ie: fever, flu-like symptoms, shortness of breath, cough etc.) before then, please call [phone].  If you test positive for Covid 19 in the 2 weeks post procedure, please call and report this information to [place].    If any biopsies were taken you will be contacted by phone or by letter within the next 1-3 weeks.  Please call us at [phone] if you have not heard about the biopsies in 3 weeks.    SIGNATURES/CONFIDENTIALITY: You and/or your care partner have signed paperwork which will be entered into your electronic medical record.  These signatures attest to the fact that that the information above on your After Visit Summary has been reviewed and is understood.  Full responsibility  of the confidentiality of this discharge information lies with you and/or your care-partner.

## 2019-06-14 NOTE — Progress Notes (Signed)
Called to room to assist during endoscopic procedure.  Patient ID and intended procedure confirmed with present staff. Received instructions for my participation in the procedure from the performing physician.  

## 2019-06-14 NOTE — Op Note (Signed)
[place] Patient Name: Loretta Barnett Procedure Date: 06/14/2019 9:57 AM MRN: [id number] Endoscopist: Docia Chuck. Henrene Pastor , MD Age: 78 Referring MD:  Date of Birth: 02-Apr-1941 Gender: Female Account #: [id number] Procedure:                Colonoscopy with cold snare polypectomy x 1 Indications:              Screening patient at increased risk: Family history                            of 1st-degree relative with colorectal cancer at                            age 61 years (or older). Previous examinations                            1998, 2004, 2010, 2015. No prior history of                            neoplasia Medicines:                Monitored Anesthesia Care Procedure:                Pre-Anesthesia Assessment:                           - Prior to the procedure, a History and Physical                            was performed, and patient medications and                            allergies were reviewed. The patient's tolerance of                            previous anesthesia was also reviewed. The risks                            and benefits of the procedure and the sedation                            options and risks were discussed with the patient.                            All questions were answered, and informed consent                            was obtained. Prior Anticoagulants: The patient has                            taken no previous anticoagulant or antiplatelet                            agents. ASA Grade Assessment: II - A patient with  mild systemic disease. After reviewing the risks                            and benefits, the patient was deemed in                            satisfactory condition to undergo the procedure.                           After obtaining informed consent, the colonoscope                            was passed under direct vision. Throughout the                            procedure, the patient's blood  pressure, pulse, and                            oxygen saturations were monitored continuously. The                            Colonoscope was introduced through the anus and                            advanced to the the cecum, identified by                            appendiceal orifice and ileocecal valve. The                            ileocecal valve, appendiceal orifice, and rectum                            were photographed. The quality of the bowel                            preparation was excellent. The colonoscopy was                            performed without difficulty. The patient tolerated                            the procedure well. The bowel preparation used was                            SUPREP via split dose instruction. Scope In: 10:11:26 AM Scope Out: 10:25:00 AM Scope Withdrawal Time: 0 hours 5 minutes 51 seconds  Total Procedure Duration: 0 hours 13 minutes 34 seconds  Findings:                 A 2 mm polyp was found in the transverse colon. The                            polyp was removed with a cold snare. Resection  and                            retrieval were complete.                           The exam was otherwise without abnormality on                            direct and retroflexion views. Complications:            No immediate complications. Estimated blood loss:                            None. Estimated Blood Loss:     Estimated blood loss: none. Impression:               - One 2 mm polyp in the transverse colon, removed                            with a cold snare. Resected and retrieved.                           - The examination was otherwise normal on direct                            and retroflexion views. Recommendation:           - Repeat colonoscopy is not recommended for                            surveillance.                           - Patient has a contact number available for                            emergencies. The signs and  symptoms of potential                            delayed complications were discussed with the                            patient. Return to normal activities tomorrow.                            Written discharge instructions were provided to the                            patient.                           - Resume previous diet.                           - Continue present medications.                           -  Await pathology results. Docia Chuck. Henrene Pastor, MD 06/14/2019 10:32:06 AM This report has been signed electronically.

## 2019-06-16 ENCOUNTER — Encounter: Payer: Self-pay | Admitting: Internal Medicine

## 2019-06-16 ENCOUNTER — Telehealth: Payer: Self-pay

## 2019-06-16 NOTE — Telephone Encounter (Signed)
  Follow up Call-  Call back number 06/14/2019  Post procedure Call Back phone  # [phone]  Permission to leave phone message Yes  Some recent data might be hidden     Patient questions:  Do you have a fever, pain , or abdominal swelling? No. Pain Score  0 *  Have you tolerated food without any problems? Yes.    Have you been able to return to your normal activities? Yes.    Do you have any questions about your discharge instructions: Diet   No. Medications  No. Follow up visit  No.  Do you have questions or concerns about your Care? No.  Actions: * If pain score is 4 or above: No action needed, pain <4.  1. Have you developed a fever since your procedure? no  2.   Have you had an respiratory symptoms (SOB or cough) since your procedure? no  3.   Have you tested positive for COVID 19 since your procedure no  4.   Have you had any family members/close contacts diagnosed with the COVID 19 since your procedure?  no   If yes to any of these questions please route to Joylene John, RN and Alphonsa Gin, Therapist, sports.

## 2019-11-25 ENCOUNTER — Other Ambulatory Visit: Payer: Self-pay

## 2019-11-25 DIAGNOSIS — Z20822 Contact with and (suspected) exposure to covid-19: Secondary | ICD-10-CM

## 2019-11-26 LAB — NOVEL CORONAVIRUS, NAA: SARS-CoV-2, NAA: NOT DETECTED

## 2020-10-26 ENCOUNTER — Ambulatory Visit: Payer: Medicare Other | Attending: Internal Medicine

## 2020-10-26 DIAGNOSIS — Z23 Encounter for immunization: Secondary | ICD-10-CM

## 2020-10-26 NOTE — Progress Notes (Signed)
   Covid-19 Vaccination Clinic  Name:  Loretta Barnett    MRN: [id number] DOB: [birth year]  10/26/2020  [name] was observed post Covid-19 immunization for 30 minutes based on pre-vaccination screening without incident. She was provided with Vaccine Information Sheet and instruction to access the V-Safe system.   [name] was instructed to call 911 with any severe reactions post vaccine: Marland Kitchen Difficulty breathing  . Swelling of face and throat  . A fast heartbeat  . A bad rash all over body  . Dizziness and weakness

## 2020-11-26 DIAGNOSIS — N179 Acute kidney failure, unspecified: Secondary | ICD-10-CM | POA: Insufficient documentation

## 2020-11-26 DIAGNOSIS — E876 Hypokalemia: Secondary | ICD-10-CM | POA: Insufficient documentation

## 2021-02-01 ENCOUNTER — Other Ambulatory Visit (HOSPITAL_COMMUNITY): Payer: Self-pay | Admitting: Family Medicine

## 2021-02-01 ENCOUNTER — Ambulatory Visit (HOSPITAL_COMMUNITY)
Admission: RE | Admit: 2021-02-01 | Discharge: 2021-02-01 | Disposition: A | Discharge status: Home / Self Care | Payer: Medicare Other | Source: Ambulatory Visit | Attending: Family Medicine | Admitting: Family Medicine

## 2021-02-01 ENCOUNTER — Other Ambulatory Visit: Payer: Self-pay

## 2021-02-01 DIAGNOSIS — M40209 Unspecified kyphosis, site unspecified: Secondary | ICD-10-CM | POA: Insufficient documentation

## 2021-02-01 DIAGNOSIS — M542 Cervicalgia: Secondary | ICD-10-CM

## 2021-02-01 DIAGNOSIS — M549 Dorsalgia, unspecified: Secondary | ICD-10-CM | POA: Diagnosis present

## 2021-04-03 ENCOUNTER — Ambulatory Visit (HOSPITAL_COMMUNITY)
Admission: RE | Admit: 2021-04-03 | Discharge: 2021-04-03 | Disposition: A | Discharge status: Home / Self Care | Payer: Medicare Other | Source: Ambulatory Visit | Attending: Family Medicine | Admitting: Family Medicine

## 2021-04-03 ENCOUNTER — Other Ambulatory Visit (HOSPITAL_COMMUNITY): Payer: Self-pay | Admitting: Family Medicine

## 2021-04-03 ENCOUNTER — Other Ambulatory Visit: Payer: Self-pay

## 2021-04-03 DIAGNOSIS — M25551 Pain in right hip: Secondary | ICD-10-CM

## 2021-08-14 DIAGNOSIS — T466X5A Adverse effect of antihyperlipidemic and antiarteriosclerotic drugs, initial encounter: Secondary | ICD-10-CM | POA: Insufficient documentation

## 2022-02-14 DIAGNOSIS — N1832 Chronic kidney disease, stage 3b: Secondary | ICD-10-CM | POA: Insufficient documentation

## 2022-05-14 DIAGNOSIS — N952 Postmenopausal atrophic vaginitis: Secondary | ICD-10-CM | POA: Insufficient documentation

## 2022-08-20 DIAGNOSIS — M25539 Pain in unspecified wrist: Secondary | ICD-10-CM | POA: Insufficient documentation

## 2022-08-20 DIAGNOSIS — M779 Enthesopathy, unspecified: Secondary | ICD-10-CM | POA: Insufficient documentation

## 2023-08-25 DIAGNOSIS — J309 Allergic rhinitis, unspecified: Secondary | ICD-10-CM | POA: Insufficient documentation

## 2023-08-25 DIAGNOSIS — M7989 Other specified soft tissue disorders: Secondary | ICD-10-CM | POA: Insufficient documentation

## 2023-12-24 ENCOUNTER — Telehealth: Payer: Self-pay | Admitting: Orthopedic Surgery

## 2023-12-24 NOTE — Telephone Encounter (Signed)
 Returned the pt's call, lvm for her to call me back tomorrow.  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

## 2024-01-09 NOTE — Progress Notes (Unsigned)
  Intake history:  There were no vitals taken for this visit. There is no height or weight on file to calculate BMI.    WHAT ARE WE SEEING YOU FOR TODAY?   right ankle(s)  How long has this bothered you? (DOI?DOS?WS?)  on ***  Anticoag.  No  Diabetes No  Heart disease No  Hypertension Yes  SMOKING HX No  Kidney disease Yes    Latest Ref Rng & Units 11/14/2015    4:26 AM 11/13/2015    4:53 PM 11/03/2015    1:29 PM  CMP  Glucose 65 - 99 mg/dL 829   562   BUN 6 - 20 mg/dL 15   14   Creatinine 1.30 - 1.00 mg/dL 8.65  7.84  6.96   Sodium 135 - 145 mmol/L 139   139   Potassium 3.5 - 5.1 mmol/L 3.2   3.2   Chloride 101 - 111 mmol/L 102   100   CO2 22 - 32 mmol/L 28   29   Calcium 8.9 - 10.3 mg/dL 8.6   9.7   Total Protein 6.5 - 8.1 g/dL   7.4   Total Bilirubin 0.3 - 1.2 mg/dL   0.3   Alkaline Phos 38 - 126 U/L   75   AST 15 - 41 U/L   16   ALT 14 - 54 U/L   12      Any ALLERGIES ______________________________________________   Treatment:  Have you taken:  Tylenol No  Advil No  Had PT No  Had injection No  Other  _________________________

## 2024-01-12 ENCOUNTER — Encounter: Payer: Self-pay | Admitting: Orthopedic Surgery

## 2024-01-12 ENCOUNTER — Other Ambulatory Visit (INDEPENDENT_AMBULATORY_CARE_PROVIDER_SITE_OTHER): Payer: Medicare Other

## 2024-01-12 ENCOUNTER — Ambulatory Visit: Payer: Medicare Other | Admitting: Orthopedic Surgery

## 2024-01-12 VITALS — BP 134/75 | HR 81 | Ht 65.0 in | Wt 177.0 lb

## 2024-01-12 DIAGNOSIS — M25571 Pain in right ankle and joints of right foot: Secondary | ICD-10-CM | POA: Diagnosis not present

## 2024-01-12 DIAGNOSIS — M7661 Achilles tendinitis, right leg: Secondary | ICD-10-CM

## 2024-01-12 MED ORDER — DICLOFENAC SODIUM 1 % EX GEL: 4.0000 g | Freq: Four times a day (QID) | CUTANEOUS | 3 refills | Status: DC
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
## 2024-01-12 NOTE — Patient Instructions (Signed)
Use Voltaren gel over the counter 2-3 times daily make sure you rub it in well each time you use it.

## 2024-03-11 ENCOUNTER — Ambulatory Visit: Payer: Medicare Other | Admitting: Orthopedic Surgery

## 2024-03-11 VITALS — Ht 65.0 in | Wt 177.0 lb

## 2024-03-11 DIAGNOSIS — M7661 Achilles tendinitis, right leg: Secondary | ICD-10-CM

## 2024-03-11 MED ORDER — MELOXICAM 7.5 MG PO TABS: 7.5000 mg | ORAL_TABLET | ORAL | 5 refills | Status: AC

## 2024-03-11 NOTE — Progress Notes (Signed)
   There were no vitals taken for this visit.  There is no height or weight on file to calculate BMI.  Chief Complaint  Patient presents with   Ankle Problem    Right achilles / no improvement     No diagnosis found.  DOI/DOS/ Date: ongoing since before  January   Unchanged

## 2024-03-11 NOTE — Progress Notes (Signed)
 Patient ID: Loretta Barnett, female   DOB: [birth year], 83 y.o.   MRN: [id number]  Chief Complaint  Patient presents with   Ankle Problem    Right achilles / no improvement    Images 83 years old female with history of Achilles tendinitis.  She has kidney disease and cannot take anti-inflammatories or was advised not to  She has not improved with topical medication presents back with complaints of pain in the back of the heel  Her examination is consistent with Achilles insertional tendinitis without tear  Recommend CAM Walker and every other day anti-inflammatory  Return in 6 weeks  Encounter Diagnosis  Name Primary?   Achilles tendinitis, right leg Yes   Meds ordered this encounter  Medications   meloxicam (MOBIC) 7.5 MG tablet    Sig: Take 1 tablet (7.5 mg total) by mouth every other day.    Dispense:  30 tablet    Refill:  5

## 2024-04-22 ENCOUNTER — Ambulatory Visit: Admitting: Orthopedic Surgery

## 2024-04-22 ENCOUNTER — Encounter: Payer: Self-pay | Admitting: Orthopedic Surgery

## 2024-04-22 DIAGNOSIS — M7661 Achilles tendinitis, right leg: Secondary | ICD-10-CM | POA: Diagnosis not present

## 2024-04-22 NOTE — Progress Notes (Signed)
 Chief Complaint  Patient presents with   Ankle Pain    Right/ achilles feels better     Encounter Diagnosis  Name Primary?   Achilles tendinitis, right leg started December 2024 Yes    83 year old female with Achilles tendinitis treated with 2 anti-inflammatories the most recent one being meloxicam  as well as topical medications and CAM Walker  She seems to be improving  Exam of the right ankle  No tenderness to palpation Full range of motion Chronic foot edema  Improved Achilles tendinitis  Released follow-up as needed

## 2024-04-22 NOTE — Progress Notes (Signed)
   There were no vitals taken for this visit.  There is no height or weight on file to calculate BMI.  Chief Complaint  Patient presents with   Ankle Pain    Right/ achilles feels better     Encounter Diagnosis  Name Primary?   Achilles tendinitis, right leg started December 2024 Yes    DOI/DOS/ Date:    Improved

## 2024-08-04 NOTE — Telephone Encounter (Signed)
 Patient is calling to follow up on request. Patient states that pharmacy states that the prescription is missing the dx code and they need prescription resent.

## 2024-08-18 ENCOUNTER — Other Ambulatory Visit (HOSPITAL_COMMUNITY): Payer: Self-pay | Admitting: Nephrology

## 2024-08-18 DIAGNOSIS — N184 Chronic kidney disease, stage 4 (severe): Secondary | ICD-10-CM

## 2024-08-27 ENCOUNTER — Ambulatory Visit (HOSPITAL_COMMUNITY)
Admission: RE | Admit: 2024-08-27 | Discharge: 2024-08-27 | Disposition: A | Discharge status: Home / Self Care | Source: Ambulatory Visit | Attending: Nephrology | Admitting: Nephrology

## 2024-08-27 DIAGNOSIS — N184 Chronic kidney disease, stage 4 (severe): Secondary | ICD-10-CM | POA: Diagnosis present
